# Patient Record
Sex: Female | Born: 1970
Health system: Southern US, Community
[De-identification: ages and names within clinical notes are randomized; demographics above are authoritative.]

## PROBLEM LIST (undated history)

## (undated) DIAGNOSIS — R0600 Dyspnea, unspecified: Secondary | ICD-10-CM

## (undated) DIAGNOSIS — F419 Anxiety disorder, unspecified: Secondary | ICD-10-CM

## (undated) DIAGNOSIS — I10 Essential (primary) hypertension: Secondary | ICD-10-CM

## (undated) DIAGNOSIS — K5900 Constipation, unspecified: Secondary | ICD-10-CM

## (undated) DIAGNOSIS — K219 Gastro-esophageal reflux disease without esophagitis: Secondary | ICD-10-CM

## (undated) DIAGNOSIS — E538 Deficiency of other specified B group vitamins: Secondary | ICD-10-CM

## (undated) DIAGNOSIS — E785 Hyperlipidemia, unspecified: Secondary | ICD-10-CM

## (undated) DIAGNOSIS — D649 Anemia, unspecified: Secondary | ICD-10-CM

## (undated) DIAGNOSIS — R7303 Prediabetes: Secondary | ICD-10-CM

## (undated) DIAGNOSIS — J302 Other seasonal allergic rhinitis: Secondary | ICD-10-CM

## (undated) HISTORY — PX: WISDOM TOOTH EXTRACTION: SHX21

## (undated) HISTORY — DX: Dyspnea, unspecified: R06.00

## (undated) HISTORY — DX: Constipation, unspecified: K59.00

## (undated) HISTORY — PX: TUBAL LIGATION: SHX77

## (undated) HISTORY — DX: Deficiency of other specified B group vitamins: E53.8

## (undated) HISTORY — DX: Gastro-esophageal reflux disease without esophagitis: K21.9

## (undated) HISTORY — DX: Prediabetes: R73.03

---

## 1997-12-15 ENCOUNTER — Other Ambulatory Visit: Admission: RE | Admit: 1997-12-15 | Discharge: 1997-12-15 | Payer: Self-pay | Admitting: *Deleted

## 1998-03-18 ENCOUNTER — Other Ambulatory Visit: Admission: RE | Admit: 1998-03-18 | Discharge: 1998-03-18 | Payer: Self-pay | Admitting: *Deleted

## 1998-04-27 ENCOUNTER — Other Ambulatory Visit: Admission: RE | Admit: 1998-04-27 | Discharge: 1998-04-27 | Payer: Self-pay | Admitting: *Deleted

## 1998-10-05 ENCOUNTER — Other Ambulatory Visit: Admission: RE | Admit: 1998-10-05 | Discharge: 1998-10-05 | Payer: Self-pay | Admitting: *Deleted

## 1999-04-14 ENCOUNTER — Other Ambulatory Visit: Admission: RE | Admit: 1999-04-14 | Discharge: 1999-04-14 | Payer: Self-pay | Admitting: *Deleted

## 1999-09-14 ENCOUNTER — Other Ambulatory Visit: Admission: RE | Admit: 1999-09-14 | Discharge: 1999-09-14 | Payer: Self-pay | Admitting: Obstetrics and Gynecology

## 1999-11-07 ENCOUNTER — Ambulatory Visit (HOSPITAL_COMMUNITY): Admission: RE | Admit: 1999-11-07 | Discharge: 1999-11-07 | Payer: Self-pay | Admitting: Obstetrics and Gynecology

## 1999-11-07 ENCOUNTER — Encounter: Payer: Self-pay | Admitting: Obstetrics and Gynecology

## 1999-12-13 ENCOUNTER — Other Ambulatory Visit: Admission: RE | Admit: 1999-12-13 | Discharge: 1999-12-13 | Payer: Self-pay | Admitting: Obstetrics and Gynecology

## 2000-05-08 ENCOUNTER — Inpatient Hospital Stay (HOSPITAL_COMMUNITY): Admission: AD | Admit: 2000-05-08 | Discharge: 2000-05-08 | Payer: Self-pay | Admitting: *Deleted

## 2000-06-17 ENCOUNTER — Inpatient Hospital Stay (HOSPITAL_COMMUNITY): Admission: AD | Admit: 2000-06-17 | Discharge: 2000-06-20 | Payer: Self-pay | Admitting: *Deleted

## 2000-07-02 ENCOUNTER — Encounter: Admission: RE | Admit: 2000-07-02 | Discharge: 2000-08-01 | Payer: Self-pay | Admitting: *Deleted

## 2001-01-23 ENCOUNTER — Other Ambulatory Visit: Admission: RE | Admit: 2001-01-23 | Discharge: 2001-01-23 | Payer: Self-pay | Admitting: *Deleted

## 2002-02-09 ENCOUNTER — Other Ambulatory Visit: Admission: RE | Admit: 2002-02-09 | Discharge: 2002-02-09 | Payer: Self-pay | Admitting: *Deleted

## 2003-02-12 ENCOUNTER — Other Ambulatory Visit: Admission: RE | Admit: 2003-02-12 | Discharge: 2003-02-12 | Payer: Self-pay | Admitting: *Deleted

## 2009-01-12 DIAGNOSIS — I10 Essential (primary) hypertension: Secondary | ICD-10-CM | POA: Insufficient documentation

## 2013-01-22 ENCOUNTER — Other Ambulatory Visit (HOSPITAL_BASED_OUTPATIENT_CLINIC_OR_DEPARTMENT_OTHER): Payer: Self-pay | Admitting: Obstetrics and Gynecology

## 2013-01-22 DIAGNOSIS — Z1231 Encounter for screening mammogram for malignant neoplasm of breast: Secondary | ICD-10-CM

## 2013-01-23 ENCOUNTER — Ambulatory Visit (HOSPITAL_BASED_OUTPATIENT_CLINIC_OR_DEPARTMENT_OTHER): Payer: Self-pay

## 2013-01-30 ENCOUNTER — Ambulatory Visit (HOSPITAL_BASED_OUTPATIENT_CLINIC_OR_DEPARTMENT_OTHER)
Admission: RE | Admit: 2013-01-30 | Discharge: 2013-01-30 | Disposition: A | Discharge status: Home / Self Care | Payer: BC Managed Care – PPO | Source: Ambulatory Visit | Attending: Obstetrics and Gynecology | Admitting: Obstetrics and Gynecology

## 2013-01-30 DIAGNOSIS — Z1231 Encounter for screening mammogram for malignant neoplasm of breast: Secondary | ICD-10-CM | POA: Insufficient documentation

## 2013-12-01 DIAGNOSIS — E7800 Pure hypercholesterolemia, unspecified: Secondary | ICD-10-CM | POA: Insufficient documentation

## 2014-03-02 ENCOUNTER — Other Ambulatory Visit (HOSPITAL_BASED_OUTPATIENT_CLINIC_OR_DEPARTMENT_OTHER): Payer: Self-pay | Admitting: Obstetrics and Gynecology

## 2014-03-02 DIAGNOSIS — Z1231 Encounter for screening mammogram for malignant neoplasm of breast: Secondary | ICD-10-CM

## 2014-03-26 ENCOUNTER — Ambulatory Visit (HOSPITAL_BASED_OUTPATIENT_CLINIC_OR_DEPARTMENT_OTHER)
Admission: RE | Admit: 2014-03-26 | Discharge: 2014-03-26 | Disposition: A | Discharge status: Home / Self Care | Payer: BC Managed Care – PPO | Source: Ambulatory Visit | Attending: Diagnostic Radiology | Admitting: Diagnostic Radiology

## 2014-03-26 DIAGNOSIS — Z1231 Encounter for screening mammogram for malignant neoplasm of breast: Secondary | ICD-10-CM | POA: Diagnosis not present

## 2015-03-22 ENCOUNTER — Other Ambulatory Visit (HOSPITAL_BASED_OUTPATIENT_CLINIC_OR_DEPARTMENT_OTHER): Payer: Self-pay | Admitting: Obstetrics and Gynecology

## 2015-03-22 DIAGNOSIS — Z1231 Encounter for screening mammogram for malignant neoplasm of breast: Secondary | ICD-10-CM

## 2015-04-01 ENCOUNTER — Ambulatory Visit (HOSPITAL_BASED_OUTPATIENT_CLINIC_OR_DEPARTMENT_OTHER)
Admission: RE | Admit: 2015-04-01 | Discharge: 2015-04-01 | Disposition: A | Discharge status: Home / Self Care | Payer: BLUE CROSS/BLUE SHIELD | Source: Ambulatory Visit | Attending: Obstetrics and Gynecology | Admitting: Obstetrics and Gynecology

## 2015-04-01 DIAGNOSIS — Z1231 Encounter for screening mammogram for malignant neoplasm of breast: Secondary | ICD-10-CM | POA: Diagnosis not present

## 2015-05-06 DIAGNOSIS — D519 Vitamin B12 deficiency anemia, unspecified: Secondary | ICD-10-CM | POA: Insufficient documentation

## 2016-10-03 DIAGNOSIS — D500 Iron deficiency anemia secondary to blood loss (chronic): Secondary | ICD-10-CM | POA: Insufficient documentation

## 2016-10-16 DIAGNOSIS — J302 Other seasonal allergic rhinitis: Secondary | ICD-10-CM | POA: Insufficient documentation

## 2017-03-21 ENCOUNTER — Other Ambulatory Visit (HOSPITAL_BASED_OUTPATIENT_CLINIC_OR_DEPARTMENT_OTHER): Payer: Self-pay | Admitting: Obstetrics and Gynecology

## 2017-03-21 DIAGNOSIS — Z1239 Encounter for other screening for malignant neoplasm of breast: Secondary | ICD-10-CM

## 2017-04-09 ENCOUNTER — Ambulatory Visit (HOSPITAL_BASED_OUTPATIENT_CLINIC_OR_DEPARTMENT_OTHER)
Admission: RE | Admit: 2017-04-09 | Discharge: 2017-04-09 | Disposition: A | Discharge status: Home / Self Care | Payer: 59 | Source: Ambulatory Visit | Attending: Obstetrics and Gynecology | Admitting: Obstetrics and Gynecology

## 2017-04-09 ENCOUNTER — Encounter (HOSPITAL_BASED_OUTPATIENT_CLINIC_OR_DEPARTMENT_OTHER): Payer: Self-pay

## 2017-04-09 DIAGNOSIS — Z1239 Encounter for other screening for malignant neoplasm of breast: Secondary | ICD-10-CM

## 2017-04-09 DIAGNOSIS — Z1231 Encounter for screening mammogram for malignant neoplasm of breast: Secondary | ICD-10-CM | POA: Insufficient documentation

## 2017-08-01 DIAGNOSIS — F4322 Adjustment disorder with anxiety: Secondary | ICD-10-CM | POA: Insufficient documentation

## 2017-11-25 ENCOUNTER — Other Ambulatory Visit: Payer: Self-pay | Admitting: Obstetrics and Gynecology

## 2017-12-04 NOTE — Patient Instructions (Addendum)
Your procedure is scheduled on: Thursday, July 25  Enter through the [employer] [facility] at: [address] up the phone at the desk and dial [phone].  Call this number if you have problems the morning of surgery: [phone].  Remember: Do NOT eat food after midnight Wednesday  Do NOT drink clear liquids (including water) after 7:30 am Thursday, day of surgery  Take these medicines the morning of surgery with a SIP OF WATER: Lexapro  Brush your teeth on the morning of surgery.  Stop herbal medications, vitamin supplements, Ibuprofen/NSAIDS 1 week prior to surgery.  Do NOT wear jewelry (body piercing), metal hair clips/bobby pins, make-up, or nail polish. Do NOT wear lotions, powders, or perfumes.  You may wear deoderant. Do NOT shave for 48 hours prior to surgery. Do NOT bring valuables to the hospital.  Have a responsible adult drive you home and stay with you for 24 hours after your procedure.  Home with Husband Nancy Sanders cell [phone]

## 2017-12-16 ENCOUNTER — Other Ambulatory Visit: Payer: Self-pay

## 2017-12-16 ENCOUNTER — Encounter (HOSPITAL_COMMUNITY): Payer: Self-pay

## 2017-12-16 ENCOUNTER — Encounter (HOSPITAL_COMMUNITY)
Admission: RE | Admit: 2017-12-16 | Discharge: 2017-12-16 | Disposition: A | Discharge status: Home / Self Care | Payer: 59 | Source: Ambulatory Visit | Attending: Obstetrics and Gynecology | Admitting: Obstetrics and Gynecology

## 2017-12-16 DIAGNOSIS — Z79899 Other long term (current) drug therapy: Secondary | ICD-10-CM | POA: Diagnosis not present

## 2017-12-16 DIAGNOSIS — F419 Anxiety disorder, unspecified: Secondary | ICD-10-CM | POA: Diagnosis not present

## 2017-12-16 DIAGNOSIS — E785 Hyperlipidemia, unspecified: Secondary | ICD-10-CM | POA: Diagnosis not present

## 2017-12-16 DIAGNOSIS — Z9851 Tubal ligation status: Secondary | ICD-10-CM | POA: Diagnosis not present

## 2017-12-16 DIAGNOSIS — D250 Submucous leiomyoma of uterus: Secondary | ICD-10-CM | POA: Diagnosis not present

## 2017-12-16 DIAGNOSIS — I10 Essential (primary) hypertension: Secondary | ICD-10-CM | POA: Diagnosis not present

## 2017-12-16 DIAGNOSIS — N920 Excessive and frequent menstruation with regular cycle: Secondary | ICD-10-CM | POA: Diagnosis not present

## 2017-12-16 DIAGNOSIS — Z793 Long term (current) use of hormonal contraceptives: Secondary | ICD-10-CM | POA: Diagnosis not present

## 2017-12-16 HISTORY — DX: Other seasonal allergic rhinitis: J30.2

## 2017-12-16 HISTORY — DX: Anemia, unspecified: D64.9

## 2017-12-16 HISTORY — DX: Anxiety disorder, unspecified: F41.9

## 2017-12-16 HISTORY — DX: Essential (primary) hypertension: I10

## 2017-12-16 HISTORY — DX: Hyperlipidemia, unspecified: E78.5

## 2017-12-16 LAB — CBC
HCT: 32.3 % — ABNORMAL LOW (ref 36.0–46.0)
Hemoglobin: 10.1 g/dL — ABNORMAL LOW (ref 12.0–15.0)
MCH: 25.4 pg — ABNORMAL LOW (ref 26.0–34.0)
MCHC: 31.3 g/dL (ref 30.0–36.0)
MCV: 81.2 fL (ref 78.0–100.0)
Platelets: 366 10*3/uL (ref 150–400)
RBC: 3.98 MIL/uL (ref 3.87–5.11)
RDW: 15.7 % — ABNORMAL HIGH (ref 11.5–15.5)
WBC: 7.7 10*3/uL (ref 4.0–10.5)

## 2017-12-16 LAB — BASIC METABOLIC PANEL
Anion gap: 11 (ref 5–15)
BUN: 7 mg/dL (ref 6–20)
CO2: 24 mmol/L (ref 22–32)
Calcium: 8.9 mg/dL (ref 8.9–10.3)
Chloride: 103 mmol/L (ref 98–111)
Creatinine, Ser: 0.6 mg/dL (ref 0.44–1.00)
GFR calc Af Amer: >60 mL/min (ref >60)
GFR calc non Af Amer: >60 mL/min (ref >60)
Glucose, Bld: 128 mg/dL — ABNORMAL HIGH (ref 70–99)
Potassium: 3.2 mmol/L — ABNORMAL LOW (ref 3.5–5.1)
Sodium: 138 mmol/L (ref 135–145)

## 2017-12-18 NOTE — Anesthesia Preprocedure Evaluation (Addendum)
Anesthesia Evaluation  Patient identified by MRN, date of birth, ID band Patient awake    Reviewed: Allergy & Precautions, NPO status , Patient's Chart, lab work & pertinent test results  Airway Mallampati: II  TM Distance: >3 FB Neck ROM: Full    Dental no notable dental hx. (+) Teeth Intact, Dental Advisory Given   Pulmonary neg pulmonary ROS,    Pulmonary exam normal breath sounds clear to auscultation       Cardiovascular Exercise Tolerance: Good hypertension, Pt. on medications Normal cardiovascular exam Rhythm:Regular Rate:Normal     Neuro/Psych negative neurological ROS  negative psych ROS   GI/Hepatic   Endo/Other    Renal/GU      Musculoskeletal   Abdominal (+) + obese,   Peds  Hematology  (+) anemia ,   Anesthesia Other Findings   Reproductive/Obstetrics negative OB ROS                             Lab Results  Component Value Date   WBC 7.7 12/16/2017   HGB 10.1 (L) 12/16/2017   HCT 32.3 (L) 12/16/2017   MCV 81.2 12/16/2017   PLT 366 12/16/2017    Anesthesia Physical Anesthesia Plan  ASA: II  Anesthesia Plan: General   Post-op Pain Management:    Induction: Intravenous  PONV Risk Score and Plan: Treatment may vary due to age or medical condition  Airway Management Planned: LMA  Additional Equipment:   Intra-op Plan:   Post-operative Plan:   Informed Consent: I have reviewed the patients History and Physical, chart, labs and discussed the procedure including the risks, benefits and alternatives for the proposed anesthesia with the patient or authorized representative who has indicated his/her understanding and acceptance.     Plan Discussed with: CRNA  Anesthesia Plan Comments:         Anesthesia Quick Evaluation

## 2017-12-19 ENCOUNTER — Ambulatory Visit (HOSPITAL_COMMUNITY): Payer: 59 | Admitting: Anesthesiology

## 2017-12-19 ENCOUNTER — Encounter (HOSPITAL_COMMUNITY): Payer: Self-pay | Admitting: *Deleted

## 2017-12-19 ENCOUNTER — Ambulatory Visit (HOSPITAL_COMMUNITY)
Admission: AD | Admit: 2017-12-19 | Discharge: 2017-12-19 | Disposition: A | Discharge status: Home / Self Care | Payer: 59 | Source: Ambulatory Visit | Attending: Obstetrics and Gynecology | Admitting: Obstetrics and Gynecology

## 2017-12-19 ENCOUNTER — Encounter (HOSPITAL_COMMUNITY)
Admission: AD | Disposition: A | Discharge status: Home / Self Care | Payer: Self-pay | Source: Ambulatory Visit | Attending: Obstetrics and Gynecology

## 2017-12-19 ENCOUNTER — Other Ambulatory Visit: Payer: Self-pay

## 2017-12-19 DIAGNOSIS — Z793 Long term (current) use of hormonal contraceptives: Secondary | ICD-10-CM | POA: Insufficient documentation

## 2017-12-19 DIAGNOSIS — I10 Essential (primary) hypertension: Secondary | ICD-10-CM | POA: Insufficient documentation

## 2017-12-19 DIAGNOSIS — N920 Excessive and frequent menstruation with regular cycle: Secondary | ICD-10-CM | POA: Diagnosis not present

## 2017-12-19 DIAGNOSIS — Z79899 Other long term (current) drug therapy: Secondary | ICD-10-CM | POA: Insufficient documentation

## 2017-12-19 DIAGNOSIS — D250 Submucous leiomyoma of uterus: Secondary | ICD-10-CM | POA: Insufficient documentation

## 2017-12-19 DIAGNOSIS — Z9851 Tubal ligation status: Secondary | ICD-10-CM | POA: Insufficient documentation

## 2017-12-19 DIAGNOSIS — E785 Hyperlipidemia, unspecified: Secondary | ICD-10-CM | POA: Insufficient documentation

## 2017-12-19 DIAGNOSIS — F419 Anxiety disorder, unspecified: Secondary | ICD-10-CM | POA: Insufficient documentation

## 2017-12-19 HISTORY — PX: DILATATION & CURETTAGE/HYSTEROSCOPY WITH MYOSURE: SHX6511

## 2017-12-19 HISTORY — PX: DILITATION & CURRETTAGE/HYSTROSCOPY WITH NOVASURE ABLATION: SHX5568

## 2017-12-19 SURGERY — DILATATION & CURETTAGE/HYSTEROSCOPY WITH NOVASURE ABLATION
Anesthesia: General | Site: Vagina

## 2017-12-19 MED ORDER — SODIUM CHLORIDE 0.9 % IR SOLN
Status: DC | PRN
  Administered 2017-12-19: 3000 mL

## 2017-12-19 MED ORDER — HYDROCODONE-ACETAMINOPHEN 7.5-325 MG PO TABS: 1.0000 | ORAL_TABLET | Freq: Once | ORAL | Status: DC | PRN

## 2017-12-19 MED ORDER — ACETAMINOPHEN 500 MG PO TABS
ORAL_TABLET | ORAL | Status: AC
  Administered 2017-12-19: 1000 mg via ORAL
  Filled 2017-12-19: qty 2

## 2017-12-19 MED ORDER — HYDROMORPHONE HCL 1 MG/ML IJ SOLN
INTRAMUSCULAR | Status: AC
  Filled 2017-12-19: qty 1

## 2017-12-19 MED ORDER — ONDANSETRON HCL 4 MG/2ML IJ SOLN
INTRAMUSCULAR | Status: AC
  Filled 2017-12-19: qty 2

## 2017-12-19 MED ORDER — GABAPENTIN 100 MG PO CAPS: 200.0000 mg | ORAL_CAPSULE | Freq: Once | ORAL | Status: DC

## 2017-12-19 MED ORDER — LIDOCAINE HCL (CARDIAC) PF 100 MG/5ML IV SOSY
PREFILLED_SYRINGE | INTRAVENOUS | Status: AC
  Filled 2017-12-19: qty 5

## 2017-12-19 MED ORDER — PROPOFOL 10 MG/ML IV BOLUS
INTRAVENOUS | Status: DC | PRN
  Administered 2017-12-19: 180 mg via INTRAVENOUS

## 2017-12-19 MED ORDER — SCOPOLAMINE 1 MG/3DAYS TD PT72
MEDICATED_PATCH | TRANSDERMAL | Status: AC
  Administered 2017-12-19: 1.5 mg via TRANSDERMAL
  Filled 2017-12-19: qty 1

## 2017-12-19 MED ORDER — HYDROMORPHONE HCL 1 MG/ML IJ SOLN
0.2500 mg | INTRAMUSCULAR | Status: DC | PRN
  Administered 2017-12-19 (×2): 0.5 mg via INTRAVENOUS

## 2017-12-19 MED ORDER — OXYCODONE-ACETAMINOPHEN 5-325 MG PO TABS: 1.0000 | ORAL_TABLET | ORAL | 0 refills | Status: AC | PRN

## 2017-12-19 MED ORDER — MIDAZOLAM HCL 2 MG/2ML IJ SOLN
INTRAMUSCULAR | Status: AC
  Filled 2017-12-19: qty 2

## 2017-12-19 MED ORDER — GABAPENTIN 300 MG PO CAPS
ORAL_CAPSULE | ORAL | Status: AC
  Administered 2017-12-19: 300 mg via ORAL
  Filled 2017-12-19: qty 1

## 2017-12-19 MED ORDER — MIDAZOLAM HCL 2 MG/2ML IJ SOLN
INTRAMUSCULAR | Status: DC | PRN
  Administered 2017-12-19: 1 mg via INTRAVENOUS

## 2017-12-19 MED ORDER — LIDOCAINE HCL (CARDIAC) PF 100 MG/5ML IV SOSY
PREFILLED_SYRINGE | INTRAVENOUS | Status: DC | PRN
  Administered 2017-12-19: 100 mg via INTRAVENOUS

## 2017-12-19 MED ORDER — FENTANYL CITRATE (PF) 100 MCG/2ML IJ SOLN
INTRAMUSCULAR | Status: DC | PRN
  Administered 2017-12-19 (×2): 50 ug via INTRAVENOUS

## 2017-12-19 MED ORDER — ONDANSETRON HCL 4 MG/2ML IJ SOLN
INTRAMUSCULAR | Status: DC | PRN
  Administered 2017-12-19: 4 mg via INTRAVENOUS

## 2017-12-19 MED ORDER — ACETAMINOPHEN 10 MG/ML IV SOLN: 1000.0000 mg | Freq: Once | INTRAVENOUS | Status: DC | PRN

## 2017-12-19 MED ORDER — ACETAMINOPHEN 500 MG PO TABS
1000.0000 mg | ORAL_TABLET | Freq: Once | ORAL | Status: AC
  Administered 2017-12-19: 1000 mg via ORAL

## 2017-12-19 MED ORDER — MEPERIDINE HCL 25 MG/ML IJ SOLN: 6.2500 mg | INTRAMUSCULAR | Status: DC | PRN

## 2017-12-19 MED ORDER — GABAPENTIN 300 MG PO CAPS
300.0000 mg | ORAL_CAPSULE | Freq: Once | ORAL | Status: AC
  Administered 2017-12-19: 300 mg via ORAL

## 2017-12-19 MED ORDER — LACTATED RINGERS IV SOLN
INTRAVENOUS | Status: DC
  Administered 2017-12-19: 13:00:00 via INTRAVENOUS

## 2017-12-19 MED ORDER — PROPOFOL 10 MG/ML IV BOLUS
INTRAVENOUS | Status: AC
  Filled 2017-12-19: qty 20

## 2017-12-19 MED ORDER — DEXAMETHASONE SODIUM PHOSPHATE 10 MG/ML IJ SOLN
INTRAMUSCULAR | Status: AC
  Filled 2017-12-19: qty 1

## 2017-12-19 MED ORDER — PROMETHAZINE HCL 25 MG/ML IJ SOLN: 6.2500 mg | INTRAMUSCULAR | Status: DC | PRN

## 2017-12-19 MED ORDER — SCOPOLAMINE 1 MG/3DAYS TD PT72
1.0000 | MEDICATED_PATCH | Freq: Once | TRANSDERMAL | Status: DC
  Administered 2017-12-19: 1.5 mg via TRANSDERMAL

## 2017-12-19 MED ORDER — CHLOROPROCAINE HCL 1 % IJ SOLN
INTRAMUSCULAR | Status: AC
  Filled 2017-12-19: qty 30

## 2017-12-19 MED ORDER — DEXAMETHASONE SODIUM PHOSPHATE 10 MG/ML IJ SOLN
INTRAMUSCULAR | Status: DC | PRN
  Administered 2017-12-19: 10 mg via INTRAVENOUS

## 2017-12-19 MED ORDER — FENTANYL CITRATE (PF) 100 MCG/2ML IJ SOLN
INTRAMUSCULAR | Status: AC
  Filled 2017-12-19: qty 2

## 2017-12-19 SURGICAL SUPPLY — 16 items
ABLATOR SURESOUND NOVASURE (ABLATOR) ×3 IMPLANT
CANISTER SUCT 3000ML PPV (MISCELLANEOUS) ×3 IMPLANT
CATH ROBINSON RED A/P 16FR (CATHETERS) ×3 IMPLANT
FILTER ARTHROSCOPY CONVERTOR (FILTER) ×3 IMPLANT
GLOVE BIOGEL PI IND STRL 7.0 (GLOVE) ×4 IMPLANT
GLOVE BIOGEL PI INDICATOR 7.0 (GLOVE) ×2
GLOVE ECLIPSE 6.5 STRL STRAW (GLOVE) ×3 IMPLANT
GOWN STRL REUS W/TWL LRG LVL3 (GOWN DISPOSABLE) ×6 IMPLANT
MYOSURE XL FIBROID REM (MISCELLANEOUS) ×3
PACK VAGINAL MINOR WOMEN LF (CUSTOM PROCEDURE TRAY) ×3 IMPLANT
PAD OB MATERNITY 4.3X12.25 (PERSONAL CARE ITEMS) ×3 IMPLANT
SEAL ROD LENS SCOPE MYOSURE (ABLATOR) ×3 IMPLANT
SYSTEM TISS REMOVAL MYSR XL RM (MISCELLANEOUS) ×2 IMPLANT
TOWEL OR 17X24 6PK STRL BLUE (TOWEL DISPOSABLE) ×6 IMPLANT
TUBING AQUILEX INFLOW (TUBING) ×3 IMPLANT
TUBING AQUILEX OUTFLOW (TUBING) ×3 IMPLANT

## 2017-12-19 NOTE — Anesthesia Postprocedure Evaluation (Signed)
Anesthesia Post Note  Patient: Nancy Sanders  Procedure(s) Performed: DILATATION & CURETTAGE/HYSTEROSCOPY WITH NOVASURE ABLATION (N/A Vagina ) HYSTEROSCOPY WITH MYOSURE XL (N/A )     Patient location during evaluation: PACU Anesthesia Type: General Level of consciousness: awake and alert Pain management: pain level controlled Vital Signs Assessment: post-procedure vital signs reviewed and stable Respiratory status: spontaneous breathing, nonlabored ventilation, respiratory function stable and patient connected to nasal cannula oxygen Cardiovascular status: blood pressure returned to baseline and stable Postop Assessment: no apparent nausea or vomiting Anesthetic complications: no    Last Vitals:  Vitals:   12/19/17 1226  BP: 125/86  Pulse: 63  Resp: 16  Temp: 36.4 C  SpO2: 100%    Last Pain:  Vitals:   12/19/17 1226  TempSrc: Oral  PainSc: 2    Pain Goal: Patients Stated Pain Goal: 3 (12/19/17 1226)               Barnet Glasgow

## 2017-12-19 NOTE — Brief Op Note (Signed)
12/19/2017  2:34 PM  PATIENT:  Josephina Gip  47 y.o. female  PRE-OPERATIVE DIAGNOSIS:  Menorrhagia, Submucosal Fibroid  POST-OPERATIVE DIAGNOSIS:  menorrhagia, submucosal fibroid  PROCEDURE:  Diagnostic hysteroscopy, hysteroscopic resection of submucosal fibroid, novasure endometrial ablation  SURGEON:  Surgeon(s) and Role:    * Servando Salina, MD - Primary  PHYSICIAN ASSISTANT:   ASSISTANTS: none   ANESTHESIA:   general FINDINGS: tubal ostias seen . Sclerosing tubal ostial opening, right posterior lateral SM fibroid, nl endocervical canal EBL:  5 mL   BLOOD ADMINISTERED:none  DRAINS: none   LOCAL MEDICATIONS USED:  NONE  SPECIMEN:  Source of Specimen:  fibroid resection  DISPOSITION OF SPECIMEN:  PATHOLOGY  COUNTS:  YES  TOURNIQUET:  * No tourniquets in log *  DICTATION: .Other Dictation: Dictation Number [id number]  PLAN OF CARE: Discharge to home after PACU  PATIENT DISPOSITION:  PACU - hemodynamically stable.   Delay start of Pharmacological VTE agent (>24hrs) due to surgical blood loss or risk of bleeding: yes

## 2017-12-19 NOTE — Anesthesia Procedure Notes (Signed)
Procedure Name: LMA Insertion Date/Time: 12/19/2017 1:45 PM Performed by: Asher Muir, CRNA Pre-anesthesia Checklist: Patient being monitored, Patient identified, Emergency Drugs available and Suction available Patient Re-evaluated:Patient Re-evaluated prior to induction Oxygen Delivery Method: Circle system utilized Preoxygenation: Pre-oxygenation with 100% oxygen Induction Type: IV induction and Inhalational induction Ventilation: Mask ventilation without difficulty LMA: LMA inserted LMA Size: 4.0 Number of attempts: 1 Dental Injury: Teeth and Oropharynx as per pre-operative assessment

## 2017-12-19 NOTE — Discharge Instructions (Addendum)
DISCHARGE INSTRUCTIONS: HYSTEROSCOPY / ENDOMETRIAL ABLATION The following instructions have been prepared to help you care for yourself upon your return home.  May Remove Scop patch on or before:7/28  May take stool softner while taking narcotic pain medication to prevent constipation.  Drink plenty of water.  Personal hygiene:  Use sanitary pads for vaginal drainage, not tampons.  Shower the day after your procedure.  NO tub baths, pools or Jacuzzis for 2-3 weeks.  Wipe front to back after using the bathroom.  Activity and limitations:  Do NOT drive or operate any equipment for 24 hours. The effects of anesthesia are still present and drowsiness may result.  Do NOT rest in bed all day.  Walking is encouraged.  Walk up and down stairs slowly.  You may resume your normal activity in one to two days or as indicated by your physician. Sexual activity: NO intercourse for at least 2 weeks after the procedure, or as indicated by your Doctor.  Diet: Eat a light meal as desired this evening. You may resume your usual diet tomorrow.  Return to Work: You may resume your work activities in one to two days or as indicated by Marine scientist.  What to expect after your surgery: Expect to have vaginal bleeding/discharge for 2-3 days and spotting for up to 10 days. It is not unusual to have soreness for up to 1-2 weeks. You may have a slight burning sensation when you urinate for the first day. Mild cramps may continue for a couple of days. You may have a regular period in 2-6 weeks.  Call your doctor for any of the following:  Excessive vaginal bleeding or clotting, saturating and changing one pad every hour.  Inability to urinate 6 hours after discharge from hospital.  Pain not relieved by pain medication.  Fever of 100.4 F or greater.  Unusual vaginal discharge or odor.  Return to office _________________Call for an appointment ___________________ Patients signature:  ______________________ Nurses signature ________________________  Post Anesthesia Care Unit [phone]   Post Anesthesia Home Care Instructions  Activity: Get plenty of rest for the remainder of the day. A responsible individual must stay with you for 24 hours following the procedure.  For the next 24 hours, DO NOT: -Drive a car -Paediatric nurse -Drink alcoholic beverages -Take any medication unless instructed by your physician -Make any legal decisions or sign important papers.  Meals: Start with liquid foods such as gelatin or soup. Progress to regular foods as tolerated. Avoid greasy, spicy, heavy foods. If nausea and/or vomiting occur, drink only clear liquids until the nausea and/or vomiting subsides. Call your physician if vomiting continues.  Special Instructions/Symptoms: Your throat may feel dry or sore from the anesthesia or the breathing tube placed in your throat during surgery. If this causes discomfort, gargle with warm salt water. The discomfort should disappear within 24 hours.  If you had a scopolamine patch placed behind your ear for the management of post- operative nausea and/or vomiting:  1. The medication in the patch is effective for 72 hours, after which it should be removed.  Wrap patch in a tissue and discard in the trash. Wash hands thoroughly with soap and water. 2. You may remove the patch earlier than 72 hours if you experience unpleasant side effects which may include dry mouth, dizziness or visual disturbances. 3. Avoid touching the patch. Wash your hands with soap and water after contact with the patch.

## 2017-12-19 NOTE — Transfer of Care (Signed)
Immediate Anesthesia Transfer of Care Note  Patient: Nancy Sanders  Procedure(s) Performed: DILATATION & CURETTAGE/HYSTEROSCOPY WITH NOVASURE ABLATION (N/A Vagina ) HYSTEROSCOPY WITH MYOSURE XL (N/A )  Patient Location: PACU  Anesthesia Type:General  Level of Consciousness: sedated  Airway & Oxygen Therapy: Patient Spontanous Breathing and Patient connected to nasal cannula oxygen  Post-op Assessment: Report given to RN  Post vital signs: Reviewed and stable  Last Vitals:  Vitals Value Taken Time  BP 142/87 12/19/2017  2:18 PM  Temp    Pulse 86 12/19/2017  2:19 PM  Resp 14 12/19/2017  2:19 PM  SpO2 100 % 12/19/2017  2:19 PM  Vitals shown include unvalidated device data.  Last Pain:  Vitals:   12/19/17 1226  TempSrc: Oral  PainSc: 2       Patients Stated Pain Goal: 3 (37/00/52 5910)  Complications: No apparent anesthesia complications

## 2017-12-19 NOTE — H&P (Signed)
Nancy Sanders is an 47 y.o. female [phone] MWF hx TL with menorrhagia here for dx hysteroscopy, novasure endom ablation for heavy cycles. Hx uterine fibroids. Benign prolif endom on ebx  Pertinent Gynecological History: Menses: flow is excessive with use of 5 pads or tampons on heaviest days Bleeding: dysfunctional uterine bleeding Contraception: tubal ligation DES exposure: denies Blood transfusions: none Sexually transmitted diseases: no past history Previous GYN Procedures: none  Last mammogram: normal Date: 2019 Last pap: normal Date:ascus neg [facility]pc 2019 OB History: G3, P2  Menstrual History: Menarche age: n/a Patient's last menstrual period was 12/12/2017 (exact date).    Past Medical History:  Diagnosis Date  . Anemia   . Anxiety   . Hyperlipidemia   . Hypertension   . Seasonal allergies   . SVD (spontaneous vaginal delivery)    x 2    Past Surgical History:  Procedure Laterality Date  . TUBAL LIGATION    . WISDOM TOOTH EXTRACTION      History reviewed. No pertinent family history.  Social History:  reports that she has never smoked. She has never used smokeless tobacco. She reports that she drinks about 2.4 - 3.6 oz of alcohol per week. She reports that she does not use drugs.  Allergies:  Allergies  Allergen Reactions  . Aspirin Other (See Comments)    Eye swelling     Medications Prior to Admission  Medication Sig Dispense Refill Last Dose  . acetaminophen (TYLENOL) 500 MG tablet Take 1,000 mg by mouth daily as needed for moderate pain or headache.   12/18/2017 at Unknown time  . atorvastatin (LIPITOR) 80 MG tablet Take 80 mg by mouth at bedtime.   12/18/2017 at Unknown time  . escitalopram (LEXAPRO) 5 MG tablet Take 5 mg by mouth daily.   12/19/2017 at 0630  . lisinopril (PRINIVIL,ZESTRIL) 20 MG tablet Take 20 mg by mouth every evening.   1 12/18/2017 at Unknown time  . norethindrone (CAMILA) 0.35 MG tablet Take 1 tablet by mouth at bedtime.   12/18/2017  at Unknown time  . cyanocobalamin (,VITAMIN B-12,) 1000 MCG/ML injection Inject 1,000 mcg into the muscle every 30 (thirty) days.   More than a month at Unknown time  . diphenhydrAMINE (BENADRYL) 25 MG tablet Take 25 mg by mouth daily as needed for allergies.   Unknown at Unknown time    Review of Systems  All other systems reviewed and are negative.   Blood pressure 125/86, pulse 63, temperature 97.6 F (36.4 C), temperature source Oral, resp. rate 16, last menstrual period 12/12/2017, SpO2 100 %. Physical Exam  Constitutional: She is oriented to person, place, and time. She appears well-developed and well-nourished.  HENT:  Head: Atraumatic.  Eyes: EOM are normal.  Neck: Neck supple.  Cardiovascular: Regular rhythm.  Respiratory: Breath sounds normal.  GI: Bowel sounds are normal.  Genitourinary: Vagina normal.  Genitourinary Comments: Cervix closed Uterus irreg And no palp mass  Musculoskeletal: She exhibits no edema.  Neurological: She is alert and oriented to person, place, and time. She has normal reflexes.  Skin: Skin is warm and dry.  Psychiatric: She has a normal mood and affect.    No results found for this or any previous visit (from the past 24 hour(s)).  No results found.  Assessment/Plan: AUB Fibroid uterus P) dx hysteroscopy, novasure endometrial ablation, resection of SM if necessary,. Risk of surgery includes infection, bleeding, injury to surrounding organ structures, internal scar tissue, failre arate 10%, 9-% reduction in flow,  uterine perforation and its risk. All ? answered  Audryanna Zurita A Diangelo Radel 12/19/2017, 1:31 PM

## 2017-12-19 NOTE — Op Note (Signed)
Nancy Sanders, ANGST MEDICAL RECORD [id number] ACCOUNT [id number] DATE OF BIRTH:Mar 17, 1971 FACILITY: [facility] LOCATION: [facility] PHYSICIAN:Meital Riehl A. Shefali Ng, MD  OPERATIVE REPORT  DATE OF PROCEDURE:  12/19/2017  PREOPERATIVE DIAGNOSES:  Menorrhagia, fibroid uterus.  PROCEDURE PERFORMED:  Diagnostic hysteroscopy, hysteroscopic resection of submucosal fibroid, NovaSure endometrial ablation.  POSTOPERATIVE DIAGNOSIS:  Submucosal fibroid, menorrhagia.  ANESTHESIA:  General.  SURGEON:  Servando Salina, MD  ASSISTANT:  None.  DESCRIPTION OF PROCEDURE:  Under adequate general anesthesia, the patient was placed in the dorsal lithotomy position.  She was sterilely prepped and draped in the usual fashion.  The bladder was catheterized with a moderate amount of urine.  Examination under anesthesia revealed a slightly enlarged uterus, irregular.  No adnexal masses could be appreciated.  Bivalve speculum was placed in the vagina.  Single tooth tenaculum was placed on the anterior lip of the cervix.  The uterus sounded to 9 cm.  The  endocervical length was 3 cm given a cavity length of 6.  The cervix was then carefully dilated to a #21 Pratt dilator.  A resectoscope was inserted into the uterine cavity.  Both tubal ostia could be seen, but they were sclerosed.  In the right lower uterine segment was a submucosal fibroid projecting into the cavity.  At that point, using the XL MyoSure resectoscope apparatus, the fibroid was resected to its base.  The resectoscope was then removed.  The NovaSure endometrial ablation apparatus was then inserted.  Cavity width of 4.5 was noted.  Using a power of 149 watts, time of 53 seconds, endometrial ablation was completed.  The hysteroscope was inserted into the cavity.  A complete endometrial ablation was noted.  The site where the resection had been performed showed a pea size fibroid at the same location  At that point, decision was made not to resect that  remaining small portion in the event of increased bleeding as a result of doing procedure.  The procedure was then terminated by removing all instruments.  No lesion in the endocervical canal was  noted.  Specimen was  fibroid resection sent to pathology.    ESTIMATED BLOOD LOSS:  Minimal.  COMPLICATIONS:  None.    FLUID DEFICIT:  45 mL.    SPONGE AND INSTRUMENT COUNTS:  x2 was correct.    The patient tolerated the procedure well and was transferred to recovery room in stable condition.  TN/NUANCE  D:12/19/2017 T:12/19/2017 JOB:[id number]

## 2017-12-20 ENCOUNTER — Encounter (HOSPITAL_COMMUNITY): Payer: Self-pay | Admitting: Obstetrics and Gynecology

## 2018-02-11 DIAGNOSIS — Z2821 Immunization not carried out because of patient refusal: Secondary | ICD-10-CM | POA: Insufficient documentation

## 2018-05-06 ENCOUNTER — Other Ambulatory Visit (HOSPITAL_BASED_OUTPATIENT_CLINIC_OR_DEPARTMENT_OTHER): Payer: Self-pay | Admitting: Obstetrics and Gynecology

## 2018-05-06 DIAGNOSIS — Z1231 Encounter for screening mammogram for malignant neoplasm of breast: Secondary | ICD-10-CM

## 2018-05-14 ENCOUNTER — Encounter (HOSPITAL_BASED_OUTPATIENT_CLINIC_OR_DEPARTMENT_OTHER): Payer: Self-pay

## 2018-05-14 ENCOUNTER — Ambulatory Visit (HOSPITAL_BASED_OUTPATIENT_CLINIC_OR_DEPARTMENT_OTHER)
Admission: RE | Admit: 2018-05-14 | Discharge: 2018-05-14 | Disposition: A | Discharge status: Home / Self Care | Payer: 59 | Source: Ambulatory Visit | Attending: Obstetrics and Gynecology | Admitting: Obstetrics and Gynecology

## 2018-05-14 DIAGNOSIS — Z1231 Encounter for screening mammogram for malignant neoplasm of breast: Secondary | ICD-10-CM | POA: Diagnosis not present

## 2018-05-15 ENCOUNTER — Other Ambulatory Visit: Payer: Self-pay | Admitting: Obstetrics and Gynecology

## 2018-05-15 DIAGNOSIS — R928 Other abnormal and inconclusive findings on diagnostic imaging of breast: Secondary | ICD-10-CM

## 2018-05-22 ENCOUNTER — Other Ambulatory Visit: Payer: Self-pay | Admitting: Obstetrics and Gynecology

## 2018-05-22 ENCOUNTER — Ambulatory Visit
Admission: RE | Admit: 2018-05-22 | Discharge: 2018-05-22 | Disposition: A | Discharge status: Home / Self Care | Payer: 59 | Source: Ambulatory Visit | Attending: Obstetrics and Gynecology | Admitting: Obstetrics and Gynecology

## 2018-05-22 DIAGNOSIS — R928 Other abnormal and inconclusive findings on diagnostic imaging of breast: Secondary | ICD-10-CM

## 2018-05-22 DIAGNOSIS — N6489 Other specified disorders of breast: Secondary | ICD-10-CM

## 2018-05-29 ENCOUNTER — Ambulatory Visit
Admission: RE | Admit: 2018-05-29 | Discharge: 2018-05-29 | Disposition: A | Discharge status: Home / Self Care | Payer: 59 | Source: Ambulatory Visit | Attending: Obstetrics and Gynecology | Admitting: Obstetrics and Gynecology

## 2018-05-29 DIAGNOSIS — N6489 Other specified disorders of breast: Secondary | ICD-10-CM

## 2018-11-04 ENCOUNTER — Other Ambulatory Visit: Payer: Self-pay | Admitting: Obstetrics and Gynecology

## 2018-11-04 DIAGNOSIS — N6489 Other specified disorders of breast: Secondary | ICD-10-CM

## 2018-11-19 ENCOUNTER — Other Ambulatory Visit: Payer: Self-pay

## 2018-11-19 ENCOUNTER — Ambulatory Visit
Admission: RE | Admit: 2018-11-19 | Discharge: 2018-11-19 | Disposition: A | Discharge status: Home / Self Care | Payer: 59 | Source: Ambulatory Visit | Attending: Obstetrics and Gynecology | Admitting: Obstetrics and Gynecology

## 2018-11-19 DIAGNOSIS — N6489 Other specified disorders of breast: Secondary | ICD-10-CM

## 2019-03-03 ENCOUNTER — Other Ambulatory Visit: Payer: Self-pay

## 2019-03-03 ENCOUNTER — Encounter (INDEPENDENT_AMBULATORY_CARE_PROVIDER_SITE_OTHER): Payer: Self-pay | Admitting: Family Medicine

## 2019-03-03 ENCOUNTER — Encounter: Payer: Self-pay | Admitting: Family Medicine

## 2019-03-03 ENCOUNTER — Ambulatory Visit (INDEPENDENT_AMBULATORY_CARE_PROVIDER_SITE_OTHER): Payer: 59 | Admitting: Family Medicine

## 2019-03-03 VITALS — BP 120/81 | HR 77 | Temp 97.7°F | Ht 63.0 in | Wt 194.0 lb

## 2019-03-03 DIAGNOSIS — Z6834 Body mass index (BMI) 34.0-34.9, adult: Secondary | ICD-10-CM

## 2019-03-03 DIAGNOSIS — R739 Hyperglycemia, unspecified: Secondary | ICD-10-CM

## 2019-03-03 DIAGNOSIS — Z9189 Other specified personal risk factors, not elsewhere classified: Secondary | ICD-10-CM

## 2019-03-03 DIAGNOSIS — E538 Deficiency of other specified B group vitamins: Secondary | ICD-10-CM

## 2019-03-03 DIAGNOSIS — R0602 Shortness of breath: Secondary | ICD-10-CM

## 2019-03-03 DIAGNOSIS — R5383 Other fatigue: Secondary | ICD-10-CM | POA: Diagnosis not present

## 2019-03-03 DIAGNOSIS — Z1331 Encounter for screening for depression: Secondary | ICD-10-CM

## 2019-03-03 DIAGNOSIS — E7849 Other hyperlipidemia: Secondary | ICD-10-CM | POA: Diagnosis not present

## 2019-03-03 DIAGNOSIS — E669 Obesity, unspecified: Secondary | ICD-10-CM

## 2019-03-03 DIAGNOSIS — Z0289 Encounter for other administrative examinations: Secondary | ICD-10-CM

## 2019-03-04 LAB — COMPREHENSIVE METABOLIC PANEL
ALT: 16 IU/L (ref 0–32)
AST: 16 IU/L (ref 0–40)
Albumin/Globulin Ratio: 1.3 (ref 1.2–2.2)
Albumin: 4.2 g/dL (ref 3.8–4.8)
Alkaline Phosphatase: 88 IU/L (ref 39–117)
BUN/Creatinine Ratio: 14 (ref 9–23)
BUN: 7 mg/dL (ref 6–24)
Bilirubin Total: 0.3 mg/dL (ref 0.0–1.2)
CO2: 22 mmol/L (ref 20–29)
Calcium: 9.7 mg/dL (ref 8.7–10.2)
Chloride: 101 mmol/L (ref 96–106)
Creatinine, Ser: 0.49 mg/dL — ABNORMAL LOW (ref 0.57–1.00)
GFR calc Af Amer: 135 mL/min/{1.73_m2} (ref >59)
GFR calc non Af Amer: 117 mL/min/{1.73_m2} (ref >59)
Globulin, Total: 3.3 g/dL (ref 1.5–4.5)
Glucose: 115 mg/dL — ABNORMAL HIGH (ref 65–99)
Potassium: 4.6 mmol/L (ref 3.5–5.2)
Sodium: 141 mmol/L (ref 134–144)
Total Protein: 7.5 g/dL (ref 6.0–8.5)

## 2019-03-04 LAB — VITAMIN D 25 HYDROXY (VIT D DEFICIENCY, FRACTURES): Vit D, 25-Hydroxy: 16.8 ng/mL — ABNORMAL LOW (ref 30.0–100.0)

## 2019-03-04 LAB — T4, FREE: Free T4: 1.13 ng/dL (ref 0.82–1.77)

## 2019-03-04 LAB — VITAMIN B12: Vitamin B-12: 349 pg/mL (ref 232–1245)

## 2019-03-04 LAB — INSULIN, RANDOM: INSULIN: 18.2 u[IU]/mL (ref 2.6–24.9)

## 2019-03-04 LAB — TSH: TSH: 1.22 u[IU]/mL (ref 0.450–4.500)

## 2019-03-04 LAB — T3: T3, Total: 117 ng/dL (ref 71–180)

## 2019-03-05 NOTE — Progress Notes (Signed)
Office: [phone]  /  Fax: [phone]   Dear Nancy Sanders,   Thank you for referring Nancy Sanders to our clinic. The following note includes my evaluation and treatment recommendations.  HPI:   Chief Complaint: OBESITY    Nancy Sanders has been referred by Nancy Salina, MD for consultation regarding her obesity and obesity related comorbidities.    Nancy Sanders (MR# [id number]) is a 48 y.o. female who presents on 03/03/2019 for obesity evaluation and treatment. Current BMI is Body mass index is 34.37 kg/m. Nancy Sanders has been struggling with her weight for many years and has been unsuccessful in either losing weight, maintaining weight loss, or reaching her healthy weight goal.     Nancy Sanders attended our information session and states she is currently in the action stage of change and ready to dedicate time achieving and maintaining a healthier weight. Nancy Sanders is interested in becoming our patient and working on intensive lifestyle modifications including (but not limited to) diet, exercise and weight loss.    Nancy Sanders states her family eats meals together she thinks her family will eat healthier with  her her desired weight loss is 54 lbs she has been heavy most of  her life she started gaining weight after getting married her heaviest weight ever was 201 lbs she has significant food cravings issues  she snacks frequently in the evenings she skips meals frequently she is frequently drinking liquids with calories she frequently makes poor food choices she frequently eats larger portions than normal  she struggles with emotional eating    Fatigue Nancy Sanders feels her energy is lower than it should be. This has worsened with weight gain and has not worsened recently. Nancy Sanders admits to daytime somnolence and  admits to waking up still tired. Patient is at risk for obstructive sleep apnea. Patent has a history of symptoms of daytime fatigue. Patient  generally gets 7 hours of sleep per night, and states they generally have generally restful sleep. Snoring is present. Apneic episodes are not present. Epworth Sleepiness Score is 1.  Dyspnea on exertion Nancy Sanders notes increasing shortness of breath with exercising and seems to be worsening over time with weight gain. She notes getting out of breath sooner with activity than she used to. This has not gotten worse recently. Nancy Sanders denies orthopnea.  Hyperlipidemia Nancy Sanders has a family history of hyperlipidemia and coronary artery disease. She had labs done recently at [facility], and recently started Zetia and on high dose statin. She has been trying to improve her cholesterol levels with intensive lifestyle modification including a low saturated fat diet, exercise and weight loss. She denies any chest pain, claudication or myalgias.  Hyperglycemia Nancy Sanders has a history of some elevated blood glucose readings without a diagnosis of diabetes. She had a recent A1c done at [facility] 01/2019, it was 5.9. She notes polyphagia and denies nausea, vomiting, or hypoglycemia. She is not on metformin.  At risk for diabetes Nancy Sanders is at higher than average risk for developing diabetes due to her obesity and hyperglycemia. She currently denies polyuria or polydipsia.  Vitamin B12 Deficiency Nancy Sanders has a diagnosis of B12 insufficiency and notes fatigue. She is on B12 injections and denies a history of bariatric surgery or pernicious anemia.   Depression Screen Nancy Sanders's Food and Mood (modified PHQ-9) score was  Depression screen PHQ 2/9 03/03/2019  Decreased Interest 1  Down, Depressed, Hopeless 0  PHQ - 2 Score 1  Altered sleeping 2  Tired, decreased energy 2  Change in appetite 1  Feeling bad or failure about yourself  0  Trouble concentrating 0  Moving slowly or fidgety/restless 0  Suicidal thoughts 0  PHQ-9 Score 6  Difficult doing work/chores Not difficult at all    ASSESSMENT AND  PLAN:  Other fatigue - Plan: EKG 12-Lead, T3, T4, free, TSH  Shortness of breath on exertion  Other hyperlipidemia  Hyperglycemia - Plan: Comprehensive metabolic panel, Insulin, random  B12 nutritional deficiency - Plan: VITAMIN D 25 Hydroxy (Vit-D Deficiency, Fractures), Vitamin B12  Depression screening  At risk for diabetes mellitus  Class 1 obesity with serious comorbidity and body mass index (BMI) of 34.0 to 34.9 in adult, unspecified obesity type  PLAN:  Fatigue Nancy Sanders was informed that her fatigue may be related to obesity, depression or many other causes. Labs will be ordered, and in the meanwhile Nancy Sanders has agreed to work on diet, exercise and weight loss to help with fatigue. Proper sleep hygiene was discussed including the need for 7-8 hours of quality sleep each night. A sleep study was not ordered based on symptoms and Epworth score.  Dyspnea on exertion Nancy Sanders's shortness of breath appears to be obesity related and exercise induced. She has agreed to work on weight loss and gradually increase exercise to treat her exercise induced shortness of breath. If Nancy Sanders follows our instructions and loses weight without improvement of her shortness of breath, we will plan to refer to pulmonology. We will monitor this condition regularly. Nancy Sanders agrees to this plan.  Hyperlipidemia Nancy Sanders was informed of the American Heart Association Guidelines emphasizing intensive lifestyle modifications as the first line treatment for hyperlipidemia. We discussed many lifestyle modifications today in depth, and Nancy Sanders will continue to work on decreasing saturated fats such as fatty red meat, butter and many fried foods. She will also increase vegetables and lean protein in her diet and continue to work on exercise and weight loss efforts. Nancy Sanders is to start her diet prescription and we will recheck labs in 3 months. Nancy Sanders agrees to follow up with our clinic in 2  weeks.  Hyperglycemia We will check insulin and glucose today and results with be discussed with Nancy Sanders in 2 weeks at her follow up visit. In the meanwhile Darly was started on a lower simple carbohydrate diet prescription and will work on weight loss efforts.  Diabetes risk counseling Shrinidhi was given extended (15 minutes) diabetes prevention counseling today. She is 48 y.o. female and has risk factors for diabetes including obesity and hyperglycemia. We discussed intensive lifestyle modifications today with an emphasis on weight loss as well as increasing exercise and decreasing simple carbohydrates in her diet.  Vitamin B12 Deficiency Nancy Sanders will start B12 rich diet. B12 supplementation was not prescribed today. We will check labs today.  Depression Screen Nancy Sanders had a mildly positive depression screening. Depression is commonly associated with obesity and often results in emotional eating behaviors. We will monitor this closely and work on CBT to help improve the non-hunger eating patterns. Referral to Psychology may be required if no improvement is seen as she continues in our clinic.  Obesity Nancy Sanders is currently in the action stage of change and her goal is to continue with weight loss efforts. I recommend Nancy Sanders begin the structured treatment plan as follows:  She has agreed to follow the Category 3 plan, minus 1 egg and only 8 oz of lean protein at dinner. Nancy Sanders has been instructed to eventually work up to a goal of 150 minutes of  combined cardio and strengthening exercise per week for weight loss and overall health benefits. We discussed the following Behavioral Modification Strategies today: increasing lean protein intake and work on meal planning and easy cooking plans   She was informed of the importance of frequent follow up visits to maximize her success with intensive lifestyle modifications for her multiple health conditions. She was informed we would discuss  her lab results at her next visit unless there is a critical issue that needs to be addressed sooner. Nancy Sanders agreed to keep her next visit at the agreed upon time to discuss these results.  ALLERGIES: Allergies  Allergen Reactions   Aspirin Other (See Comments)    Eye swelling     MEDICATIONS: Current Outpatient Medications on File Prior to Visit  Medication Sig Dispense Refill   acetaminophen (TYLENOL) 500 MG tablet Take 1,000 mg by mouth daily as needed for moderate pain or headache.     atorvastatin (LIPITOR) 80 MG tablet Take 80 mg by mouth at bedtime.     calcium carbonate (TUMS - DOSED IN MG ELEMENTAL CALCIUM) 500 MG chewable tablet Chew 1 tablet by mouth daily as needed for indigestion or heartburn.     cyanocobalamin (,VITAMIN B-12,) 1000 MCG/ML injection Inject 1,000 mcg into the muscle every 30 (thirty) days.     ezetimibe (ZETIA) 10 MG tablet Take 10 mg by mouth daily.     lisinopril (PRINIVIL,ZESTRIL) 20 MG tablet Take 30 mg by mouth every evening.   1   No current facility-administered medications on file prior to visit.     PAST MEDICAL HISTORY: Past Medical History:  Diagnosis Date   Anemia    Anxiety    Constipation    Dyspnea    GERD (gastroesophageal reflux disease)    Hyperlipidemia    Hypertension    Prediabetes    Seasonal allergies    SVD (spontaneous vaginal delivery)    x 2   Vitamin B 12 deficiency     PAST SURGICAL HISTORY: Past Surgical History:  Procedure Laterality Date   DILATATION & CURETTAGE/HYSTEROSCOPY WITH MYOSURE N/A 12/19/2017   Procedure: HYSTEROSCOPY WITH MYOSURE XL;  Surgeon: Nancy Salina, MD;  Location: [facility];  Service: Gynecology;  Laterality: N/A;   DILITATION & CURRETTAGE/HYSTROSCOPY WITH NOVASURE ABLATION N/A 12/19/2017   Procedure: DILATATION & CURETTAGE/HYSTEROSCOPY WITH NOVASURE ABLATION;  Surgeon: Nancy Salina, MD;  Location: [facility];  Service: Gynecology;  Laterality: N/A;   TUBAL  LIGATION     WISDOM TOOTH EXTRACTION      SOCIAL HISTORY: Social History   Tobacco Use   Smoking status: Never Smoker   Smokeless tobacco: Never Used  Substance Use Topics   Alcohol use: Yes    Alcohol/week: 4.0 - 6.0 standard drinks    Types: 4 - 6 Standard drinks or equivalent per week   Drug use: Never    FAMILY HISTORY: Family History  Problem Relation Age of Onset   Diabetes Mother    Hypertension Mother    Obesity Mother    Diabetes Father    Hypertension Father    Hyperlipidemia Father    Heart disease Father    Stroke Father    Cancer Father    Sleep apnea Father    Obesity Father     ROS: Review of Systems  Constitutional: Positive for malaise/fatigue. Negative for weight loss.  Eyes:       + Wear glasses or contacts  Respiratory: Positive for shortness of breath (with exertion).  Cardiovascular: Negative for chest pain, orthopnea and claudication.  Gastrointestinal: Positive for heartburn. Negative for nausea and vomiting.  Genitourinary: Negative for frequency.  Musculoskeletal: Negative for myalgias.  Neurological: Positive for headaches.  Endo/Heme/Allergies: Negative for polydipsia. Bruises/bleeds easily.       Negative hypoglycemia Positive polyphagia    PHYSICAL EXAM: Blood pressure 120/81, pulse 77, temperature 97.7 F (36.5 C), temperature source Oral, height 5\' 3"  (1.6 m), weight 194 lb (88 kg), SpO2 100 %. Body mass index is 34.37 kg/m. Physical Exam Vitals signs reviewed.  Constitutional:      Appearance: Normal appearance. She is obese.  HENT:     Head: Normocephalic and atraumatic.     Nose: Nose normal.  Eyes:     General: No scleral icterus.    Extraocular Movements: Extraocular movements intact.  Neck:     Musculoskeletal: Normal range of motion and neck supple.     Comments: No thyromegaly present Cardiovascular:     Rate and Rhythm: Normal rate and regular rhythm.     Pulses: Normal pulses.     Heart  sounds: Normal heart sounds.  Pulmonary:     Effort: Pulmonary effort is normal. No respiratory distress.     Breath sounds: Normal breath sounds.  Abdominal:     Palpations: Abdomen is soft.     Tenderness: There is no abdominal tenderness.     Comments: + Obesity  Musculoskeletal: Normal range of motion.     Right lower leg: No edema.     Left lower leg: No edema.  Skin:    General: Skin is warm and dry.  Neurological:     Mental Status: She is alert and oriented to person, place, and time.     Coordination: Coordination normal.  Psychiatric:        Mood and Affect: Mood normal.        Behavior: Behavior normal.     RECENT LABS AND TESTS: BMET    Component Value Date/Time   NA 141 03/03/2019 0850   K 4.6 03/03/2019 0850   CL 101 03/03/2019 0850   CO2 22 03/03/2019 0850   GLUCOSE 115 (H) 03/03/2019 0850   GLUCOSE 128 (H) 12/16/2017 0923   BUN 7 03/03/2019 0850   CREATININE 0.49 (L) 03/03/2019 0850   CALCIUM 9.7 03/03/2019 0850   GFRNONAA 117 03/03/2019 0850   GFRAA 135 03/03/2019 0850   No results found for: HGBA1C Lab Results  Component Value Date   INSULIN 18.2 03/03/2019   CBC    Component Value Date/Time   WBC 7.7 12/16/2017 0923   RBC 3.98 12/16/2017 0923   HGB 10.1 (L) 12/16/2017 0923   HCT 32.3 (L) 12/16/2017 0923   PLT 366 12/16/2017 0923   MCV 81.2 12/16/2017 0923   MCH 25.4 (L) 12/16/2017 0923   MCHC 31.3 12/16/2017 0923   RDW 15.7 (H) 12/16/2017 0923   Iron/TIBC/Ferritin/ %Sat No results found for: IRON, TIBC, FERRITIN, IRONPCTSAT Lipid Panel  No results found for: CHOL, TRIG, HDL, CHOLHDL, VLDL, LDLCALC, LDLDIRECT Hepatic Function Panel     Component Value Date/Time   PROT 7.5 03/03/2019 0850   ALBUMIN 4.2 03/03/2019 0850   AST 16 03/03/2019 0850   ALT 16 03/03/2019 0850   ALKPHOS 88 03/03/2019 0850   BILITOT 0.3 03/03/2019 0850      Component Value Date/Time   TSH 1.220 03/03/2019 0850    ECG  shows NSR with a rate of 74  BPM INDIRECT CALORIMETER done today shows  a VO2 of 278 and a REE of 1934.  Her calculated basal metabolic rate is [id number] thus her basal metabolic rate is better than expected.       OBESITY BEHAVIORAL INTERVENTION VISIT  Today's visit was # 1   Starting weight: 194 lbs Starting date: 03/03/2019 Today's weight : 194 lbs  Today's date: 03/03/2019 Total lbs lost to date: 0    ASK: We discussed the diagnosis of obesity with Nancy Sanders today and Nancy Sanders agreed to give [place] permission to discuss obesity behavioral modification therapy today.  ASSESS: Jahiya has the diagnosis of obesity and her BMI today is 34.37 Pearley is in the action stage of change   ADVISE: Surie was educated on the multiple health risks of obesity as well as the benefit of weight loss to improve her health. She was advised of the need for long term treatment and the importance of lifestyle modifications to improve her current health and to decrease her risk of future health problems.  AGREE: Multiple dietary modification options and treatment options were discussed and  Kevan agreed to follow the recommendations documented in the above note.  ARRANGE: Nalah was educated on the importance of frequent visits to treat obesity as outlined per CMS and USPSTF guidelines and agreed to schedule her next follow up appointment today.  I, Trixie Dredge, am acting as transcriptionist for Dennard Nip, MD  I have reviewed the above documentation for accuracy and completeness, and I agree with the above. -Dennard Nip, MD

## 2019-03-09 NOTE — Progress Notes (Signed)
Office: [phone]  /  Fax: [phone]    Date: March 11, 2019   Appointment Start Time: 12:12pm Duration: 27 minutes Provider: Glennie Isle, Psy.D. Type of Session: Intake for Individual Therapy  Location of Patient: Parked in car  Location of Provider: Provider's Home Type of Contact: Telepsychological Visit via [employer] WebEx  Informed Consent: This provider called Nancy Sanders at 12:10pm to assist with connection for today's appointment. She noted she is ready and would join shortly. Of note, today's appointment was initiated late due to this provider. Prior to proceeding with today's appointment, two pieces of identifying information were obtained from Nancy Sanders to verify identity. In addition, Nancy Sanders's physical location at the time of this appointment was obtained. Nancy Sanders reported she was parked in her car at work and provided the address. In the event of technical difficulties, Nancy Sanders shared a phone number she could be reached at. Nancy Sanders and this provider participated in today's telepsychological service. Also, Nancy Sanders denied anyone else being present in the room or on the WebEx appointment.   The provider's role was explained to Nancy Sanders. The provider reviewed and discussed issues of confidentiality, privacy, and limits therein (e.g., reporting obligations). In addition to verbal informed consent, written informed consent for psychological services was obtained from [facility] prior to the initial intake interview. Written consent included information concerning the practice, financial arrangements, and confidentiality and patients' rights. Since the clinic is not a 24/7 crisis center, mental health emergency resources were shared, and the provider explained MyChart, e-mail, voicemail, and/or other messaging systems should be utilized only for non-emergency reasons. This provider also explained that information obtained during appointments will be placed in Nancy Sanders's  medical record in a confidential manner and relevant information will be shared with other providers at [facility] that she meets with for coordination of care. Nancy Sanders verbally acknowledged understanding of the aforementioned, and agreed to use mental health emergency resources discussed if needed. Moreover, Nancy Sanders agreed information may be shared with other [facility] providers as needed for coordination of care. By signing the service agreement document, Nancy Sanders provided written consent for coordination of care.   Prior to initiating telepsychological services, Nancy Sanders was provided with an informed consent document, which included the development of a safety plan (i.e., an emergency contact and emergency resources) in the event of an emergency/crisis. Nancy Sanders expressed understanding of the rationale of the safety plan and provided consent for this provider to reach out to her emergency contact in the event of an emergency/crisis. Nancy Sanders returned the completed consent form prior to today's appointment. This provider verbally reviewed the consent form during today's appointment prior to proceeding with the appointment. Nancy Sanders verbally acknowledged understanding that she is ultimately responsible for understanding her insurance benefits as it relates to reimbursement of telepsychological and in-person services. This provider also reviewed confidentiality, as it relates to telepsychological services, as well as the rationale for telepsychological services. More specifically, this provider's clinic is providing telepsychological services as an option for appointments to reduce exposure to COVID-19. Nancy Sanders expressed understanding regarding the rationale for telepsychological services. In addition, this provider explained the telepsychological services informed consent document would be considered an addendum to the initial consent document/service agreement. Nancy Sanders verbally  consented to proceed.   Chief Complaint/HPI: Nancy Sanders was referred by Nancy Sanders. During the initial appointment with Nancy Sanders at [facility] Sanders on March 03, 2019, Nancy Sanders reported experiencing the following: significant food cravings issues , snacking frequently in the evenings,  frequently drinking liquids with calories, frequently making poor food choices, frequently eating larger portions than normal , struggling with emotional eating and skipping meals frequently. Nancy Sanders's Food and Mood (modified PHQ-9) score was 6.  During today's appointment, Nancy Sanders was verbally administered a questionnaire assessing various behaviors related to emotional eating. Nancy Sanders endorsed the following: find food is comforting to you and overeat when you are worried about something. She shared she craves salty foods, such as pickles. She added, "I don't snack a whole a lot." Nancy Sanders noted she is unsure of the onset of emotional eating. She described the current frequency of emotional eating as "not very often." In addition, Nancy Sanders denied a history of binge eating. Nancy Sanders denied a history of restricting food intake, purging and engagement in other compensatory strategies, and has never been diagnosed with an eating disorder. She also denied a history of treatment for emotional eating. Moreover, Nancy Sanders noted a history of not eating breakfast. She further reported a history of trying diets and discussed feeling guilt when deviating. Regarding the current meal plan, Nancy Sanders reported she is "struggling to eat" all the food. Furthermore, Nancy Sanders denied other problems of concern.    Mental Status Examination:  Appearance: neat Behavior: cooperative Mood: euthymic Affect: mood congruent Speech: normal in rate, volume, and tone Eye Contact: appropriate Psychomotor Activity: appropriate Thought Process: linear, logical, and goal directed  Content/Perceptual Disturbances: denies suicidal  and homicidal ideation, plan, and intent and no hallucinations, delusions, bizarre thinking or behavior reported or observed Orientation: time, person, place and purpose of appointment Cognition/Sensorium: memory, attention, language, and fund of knowledge intact  Insight: good Judgment: good  Family & Psychosocial History: Yoltzin reported she is married and she has two children (ages 5 and 34). 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 Additionally, Teyona shared her highest level of education obtained is "two, two and a half years of college." Currently, Ortencia's social support system consists of her husband, co-workers, and close friends. Moreover, Mariapaula stated she resides with her husband and children.   Medical History:  Past Medical History:  Diagnosis Date   Anemia    Anxiety    Constipation    Dyspnea    GERD (gastroesophageal reflux disease)    Hyperlipidemia    Hypertension    Prediabetes    Seasonal allergies    SVD (spontaneous vaginal delivery)    x 2   Vitamin B 12 deficiency    Past Surgical History:  Procedure Laterality Date   DILATATION & CURETTAGE/HYSTEROSCOPY WITH MYOSURE N/A 12/19/2017   Procedure: HYSTEROSCOPY WITH MYOSURE XL;  Surgeon: Servando Salina, MD;  Location: [facility];  Service: Gynecology;  Laterality: N/A;   DILITATION & CURRETTAGE/HYSTROSCOPY WITH NOVASURE ABLATION N/A 12/19/2017   Procedure: DILATATION & CURETTAGE/HYSTEROSCOPY WITH NOVASURE ABLATION;  Surgeon: Servando Salina, MD;  Location: [facility];  Service: Gynecology;  Laterality: N/A;   TUBAL LIGATION     WISDOM TOOTH EXTRACTION     Current Outpatient Medications on File Prior to Visit  Medication Sig Dispense Refill   acetaminophen (TYLENOL) 500 MG tablet Take 1,000 mg by mouth daily as needed for moderate pain or headache.     atorvastatin (LIPITOR) 80 MG tablet Take 80 mg by mouth at bedtime.      calcium carbonate (TUMS - DOSED IN MG ELEMENTAL CALCIUM) 500 MG chewable tablet Chew 1 tablet by mouth daily as needed for indigestion or heartburn.     cyanocobalamin (,VITAMIN B-12,) 1000 MCG/ML  injection Inject 1,000 mcg into the muscle every 30 (thirty) days.     ezetimibe (ZETIA) 10 MG tablet Take 10 mg by mouth daily.     lisinopril (PRINIVIL,ZESTRIL) 20 MG tablet Take 30 mg by mouth every evening.   1   No current facility-administered medications on file prior to visit.   Merilynn denied a history of head injuries and loss of consciousness.    Mental Health History: Ismael denied a history of therapeutic services. Timi denied a history of hospitalizations for psychiatric concerns, and has never met with a psychiatrist. Renny stated was previously prescribed Xanax PRN and Lexapro. She explained she was prescribed Xanax for when she goes on cruises.  Lillyauna denied a family history of mental health related concerns. Milynn denied a trauma history, including psychological, physical  and sexual abuse, as well as neglect.   Flordia described her typical mood as "pretty good" and "pretty low key." Lasheba endorsed current alcohol use. More specifically, she noted she consumes 2-3 [employer] on work days and 4-5 mixed drinks on the weekends over the course of multiple hours. She denied tobacco use. She denied illicit/recreational substance use. Regarding caffeine intake, Mirtha reported consuming diet soda ("couple glasses") daily. Furthermore, Nancy Sanders denied experiencing the following: hopelessness, hallucinations and delusions, paranoia, symptoms of mania (e.g., expansive mood, flighty ideas, decreased need for sleep, engagement in risky behaviors), angry outbursts, crying spells, panic attacks and decreased motivation. She also denied history of and current suicidal ideation, plan, and intent; history of and current homicidal ideation, plan, and intent; and history of and current  engagement in self-harm.  The following strengths were reported by Nancy Sanders: nice person and get along with everyone. The following strengths were observed by this provider: ability to express thoughts and feelings during the therapeutic session, ability to establish and benefit from a therapeutic relationship, ability to learn and practice coping skills, willingness to work toward established goal(s) with the clinic and ability to engage in reciprocal conversation.  Legal History: Malerie denied a history of legal involvement.   Structured Assessment Results: The Patient Health Questionnaire-9 (PHQ-9) is a self-report measure that assesses symptoms and severity of depression over the course of the last two weeks. Nea obtained a score of 0. Little interest or pleasure in doing things 0  Feeling down, depressed, or hopeless 0  Trouble falling or staying asleep, or sleeping too much 0  Feeling tired or having little energy 0  Poor appetite or overeating 0  Feeling bad about yourself --- or that you are a failure or have let yourself or your family down 0  Trouble concentrating on things, such as reading the newspaper or watching television 0  Moving or speaking so slowly that other people could have noticed? Or the opposite --- being so fidgety or restless that you have been moving around a lot more than usual 0  Thoughts that you would be better off dead or hurting yourself in some way 0  PHQ-9 Score 0    The Generalized Anxiety Disorder-7 (GAD-7) is a brief self-report measure that assesses symptoms of anxiety over the course of the last two weeks. Brinnley obtained a score of 0. Feeling nervous, anxious, on edge 0  Not being able to stop or control worrying 0  Worrying too much about different things 0  Trouble relaxing 0  Being so restless that it's hard to sit still 0  Becoming easily annoyed or irritable 0  Feeling afraid as if something awful might  happen 0  GAD-7 Score 0    Interventions: A chart review was conducted prior to the clinical intake interview. The PHQ-9, and GAD-7 were verbally administered as well as a Mood and Food questionnaire to assess various behaviors related to emotional eating. Throughout session, empathic reflections and validation was provided. Afsheen declined future appointments. Psychoeducation regarding emotional versus physical hunger was provided to increase awareness of hunger patterns and subsequent eating. Nancy Sanders provided verbal consent during today's appointment for this provider to send the handout via e-mail.   Provisional DSM-5 Diagnosis: 311 (F32.8) Other Specified Depressive Disorder, Emotional Eating Behaviors  Plan: Ivis declined future appointments with this provider and noted her eating habits do not "dramatically" impact her life. She acknowledged understanding that she may request a follow-up appointment with this provider in the future as long as she is still established with the clinic. No further follow-up planned by this provider.

## 2019-03-11 ENCOUNTER — Other Ambulatory Visit: Payer: Self-pay

## 2019-03-11 ENCOUNTER — Ambulatory Visit (INDEPENDENT_AMBULATORY_CARE_PROVIDER_SITE_OTHER): Payer: 59 | Admitting: Psychology

## 2019-03-11 DIAGNOSIS — F3289 Other specified depressive episodes: Secondary | ICD-10-CM

## 2019-03-17 ENCOUNTER — Ambulatory Visit (INDEPENDENT_AMBULATORY_CARE_PROVIDER_SITE_OTHER): Payer: 59 | Admitting: Family Medicine

## 2019-03-17 ENCOUNTER — Other Ambulatory Visit: Payer: Self-pay

## 2019-03-17 VITALS — BP 110/76 | HR 91 | Temp 97.7°F | Ht 63.0 in | Wt 185.0 lb

## 2019-03-17 DIAGNOSIS — E559 Vitamin D deficiency, unspecified: Secondary | ICD-10-CM | POA: Diagnosis not present

## 2019-03-17 DIAGNOSIS — Z6832 Body mass index (BMI) 32.0-32.9, adult: Secondary | ICD-10-CM

## 2019-03-17 DIAGNOSIS — E669 Obesity, unspecified: Secondary | ICD-10-CM | POA: Diagnosis not present

## 2019-03-17 DIAGNOSIS — R7303 Prediabetes: Secondary | ICD-10-CM

## 2019-03-17 DIAGNOSIS — Z9189 Other specified personal risk factors, not elsewhere classified: Secondary | ICD-10-CM

## 2019-03-17 MED ORDER — VITAMIN D (ERGOCALCIFEROL) 1.25 MG (50000 UNIT) PO CAPS: 50000.0000 [IU] | ORAL_CAPSULE | ORAL | 0 refills | Status: DC

## 2019-03-18 NOTE — Progress Notes (Signed)
Office: [phone]  /  Fax: [phone]   HPI:   Chief Complaint: OBESITY Nancy Sanders is here to discuss her progress with her obesity treatment plan. She is on the Category 3 plan and is following her eating plan approximately 90 % of the time. She states she is walking for 2-3 miles 2-3 times per week. Nancy Sanders has done very well with follow her plan. She struggled with eating all of her protein at dinner. Her hunger was very controlled.  Her weight is 185 lb (83.9 kg) today and has had a weight loss of 9 pounds over a period of 2 weeks since her last visit. She has lost 9 lbs since starting treatment with [place].  Pre-Diabetes Nancy Sanders has a diagnosis of pre-diabetes based on her elevated Hgb A1c and was informed this puts her at greater risk of developing diabetes. Her recent A1c at her primary care physician office was 5.9. She has and elevated fasting glucose and insulin. Her polyphagia has greatly improved on her diet prescription. She is not taking metformin currently and continues to work on diet and exercise to decrease risk of diabetes. She denies nausea or hypoglycemia.  At risk for diabetes Nancy Sanders is at higher than average risk for developing diabetes due to her obesity and pre-diabetes. She currently denies polyuria or polydipsia.  Vitamin D Deficiency Nancy Sanders has a new diagnosis of vitamin D deficiency. She is not currently taking Vit D. She notes fatigue and denies nausea, vomiting or muscle weakness.  ASSESSMENT AND PLAN:  Prediabetes  Vitamin D deficiency - Plan: Vitamin D, Ergocalciferol, (DRISDOL) 1.25 MG (50000 UT) CAPS capsule  At risk for diabetes mellitus  Class 1 obesity with serious comorbidity and body mass index (BMI) of 32.0 to 32.9 in adult, unspecified obesity type  PLAN:  Pre-Diabetes Nancy Sanders will continue to work on weight loss, diet, exercise, and decreasing simple carbohydrates in her diet to help decrease the risk of diabetes. We dicussed  metformin including benefits and risks. She was informed that eating too many simple carbohydrates or too many calories at one sitting increases the likelihood of GI side effects. Nancy Sanders declined metformin for now and a prescription was not written today. Nancy Sanders agrees to follow up with [place] as directed to monitor her progress.  Diabetes risk counseling Nancy Sanders was given extended (30 minutes) diabetes prevention counseling today. She is 48 y.o. female and has risk factors for diabetes including obesity and pre-diabetes. We discussed intensive lifestyle modifications today with an emphasis on weight loss as well as increasing exercise and decreasing simple carbohydrates in her diet.  Vitamin D Deficiency Nancy Sanders was informed that low vitamin D levels contributes to fatigue and are associated with obesity, breast, and colon cancer. Nancy Sanders agrees to start prescription Vit D 50,000 IU every week #4 with no refills. She will follow up for routine testing of vitamin D, at least 2-3 times per year. She was informed of the risk of over-replacement of vitamin D and agrees to not increase her dose unless she discusses this with [place] first. Nancy Sanders agrees to follow up with our clinic in 2 weeks.  Obesity Nancy Sanders is currently in the action stage of change. As such, her goal is to continue with weight loss efforts She has agreed to follow the Category 3 plan  Lean meat equivalents were discussed.  Nancy Sanders has been instructed to work up to a goal of 150 minutes of combined cardio and strengthening exercise per week for weight loss and overall health benefits. We  discussed the following Behavioral Modification Strategies today: decreasing simple carbohydrates  and increasing vegetables   Nancy Sanders has agreed to follow up with our clinic in 2 weeks. She was informed of the importance of frequent follow up visits to maximize her success with intensive lifestyle modifications for her multiple health conditions.   ALLERGIES: Allergies  Allergen Reactions  . Aspirin Other (See Comments)    Eye swelling     MEDICATIONS: Current Outpatient Medications on File Prior to Visit  Medication Sig Dispense Refill  . acetaminophen (TYLENOL) 500 MG tablet Take 1,000 mg by mouth daily as needed for moderate pain or headache.    Marland Kitchen atorvastatin (LIPITOR) 80 MG tablet Take 80 mg by mouth at bedtime.    . calcium carbonate (TUMS - DOSED IN MG ELEMENTAL CALCIUM) 500 MG chewable tablet Chew 1 tablet by mouth daily as needed for indigestion or heartburn.    . cyanocobalamin (,VITAMIN B-12,) 1000 MCG/ML injection Inject 1,000 mcg into the muscle every 30 (thirty) days.    Marland Kitchen ezetimibe (ZETIA) 10 MG tablet Take 10 mg by mouth daily.    Marland Kitchen lisinopril (PRINIVIL,ZESTRIL) 20 MG tablet Take 30 mg by mouth every evening.   1   No current facility-administered medications on file prior to visit.     PAST MEDICAL HISTORY: Past Medical History:  Diagnosis Date  . Anemia   . Anxiety   . Constipation   . Dyspnea   . GERD (gastroesophageal reflux disease)   . Hyperlipidemia   . Hypertension   . Prediabetes   . Seasonal allergies   . SVD (spontaneous vaginal delivery)    x 2  . Vitamin B 12 deficiency     PAST SURGICAL HISTORY: Past Surgical History:  Procedure Laterality Date  . DILATATION & CURETTAGE/HYSTEROSCOPY WITH MYOSURE N/A 12/19/2017   Procedure: HYSTEROSCOPY WITH MYOSURE XL;  Surgeon: Servando Salina, MD;  Location: [facility];  Service: Gynecology;  Laterality: N/A;  . DILITATION & CURRETTAGE/HYSTROSCOPY WITH NOVASURE ABLATION N/A 12/19/2017   Procedure: DILATATION & CURETTAGE/HYSTEROSCOPY WITH NOVASURE ABLATION;  Surgeon: Servando Salina, MD;  Location: [facility];  Service: Gynecology;  Laterality: N/A;  . TUBAL LIGATION    . WISDOM TOOTH EXTRACTION      SOCIAL HISTORY: Social History   Tobacco Use  . Smoking status: Never Smoker  . Smokeless tobacco: Never Used  Substance Use Topics  . Alcohol  use: Yes    Alcohol/week: 4.0 - 6.0 standard drinks    Types: 4 - 6 Standard drinks or equivalent per week  . Drug use: Never    FAMILY HISTORY: Family History  Problem Relation Age of Onset  . Diabetes Mother   . Hypertension Mother   . Obesity Mother   . Diabetes Father   . Hypertension Father   . Hyperlipidemia Father   . Heart disease Father   . Stroke Father   . Cancer Father   . Sleep apnea Father   . Obesity Father     ROS: Review of Systems  Constitutional: Positive for malaise/fatigue and weight loss.  Gastrointestinal: Negative for nausea and vomiting.  Genitourinary: Negative for frequency.  Musculoskeletal:       Negative muscle weakness  Endo/Heme/Allergies: Negative for polydipsia.       Positive polyphagia Negative hypoglycemia    PHYSICAL EXAM: Blood pressure 110/76, pulse 91, temperature 97.7 F (36.5 C), temperature source Oral, height 5\' 3"  (1.6 m), weight 185 lb (83.9 kg), SpO2 99 %. Body mass index is 32.77 kg/m.  Physical Exam Vitals signs reviewed.  Constitutional:      Appearance: Normal appearance. She is obese.  Cardiovascular:     Rate and Rhythm: Normal rate.     Pulses: Normal pulses.  Pulmonary:     Effort: Pulmonary effort is normal.     Breath sounds: Normal breath sounds.  Musculoskeletal: Normal range of motion.  Skin:    General: Skin is warm and dry.  Neurological:     Mental Status: She is alert and oriented to person, place, and time.  Psychiatric:        Mood and Affect: Mood normal.        Behavior: Behavior normal.     RECENT LABS AND TESTS: BMET    Component Value Date/Time   NA 141 03/03/2019 0850   K 4.6 03/03/2019 0850   CL 101 03/03/2019 0850   CO2 22 03/03/2019 0850   GLUCOSE 115 (H) 03/03/2019 0850   GLUCOSE 128 (H) 12/16/2017 0923   BUN 7 03/03/2019 0850   CREATININE 0.49 (L) 03/03/2019 0850   CALCIUM 9.7 03/03/2019 0850   GFRNONAA 117 03/03/2019 0850   GFRAA 135 03/03/2019 0850   No results  found for: HGBA1C Lab Results  Component Value Date   INSULIN 18.2 03/03/2019   CBC    Component Value Date/Time   WBC 7.7 12/16/2017 0923   RBC 3.98 12/16/2017 0923   HGB 10.1 (L) 12/16/2017 0923   HCT 32.3 (L) 12/16/2017 0923   PLT 366 12/16/2017 0923   MCV 81.2 12/16/2017 0923   MCH 25.4 (L) 12/16/2017 0923   MCHC 31.3 12/16/2017 0923   RDW 15.7 (H) 12/16/2017 0923   Iron/TIBC/Ferritin/ %Sat No results found for: IRON, TIBC, FERRITIN, IRONPCTSAT Lipid Panel  No results found for: CHOL, TRIG, HDL, CHOLHDL, VLDL, LDLCALC, LDLDIRECT Hepatic Function Panel     Component Value Date/Time   PROT 7.5 03/03/2019 0850   ALBUMIN 4.2 03/03/2019 0850   AST 16 03/03/2019 0850   ALT 16 03/03/2019 0850   ALKPHOS 88 03/03/2019 0850   BILITOT 0.3 03/03/2019 0850      Component Value Date/Time   TSH 1.220 03/03/2019 0850      OBESITY BEHAVIORAL INTERVENTION VISIT  Today's visit was # 2   Starting weight: 194 lbs Starting date: 03/03/2019 Today's weight : 185 lbs Today's date: 03/17/2019 Total lbs lost to date: 9    ASK: We discussed the diagnosis of obesity with Nancy Sanders today and Nancy Sanders agreed to give [place] permission to discuss obesity behavioral modification therapy today.  ASSESS: Nancy Sanders has the diagnosis of obesity and her BMI today is 32.78 Nancy Sanders is in the action stage of change   ADVISE: Nancy Sanders was educated on the multiple health risks of obesity as well as the benefit of weight loss to improve her health. She was advised of the need for long term treatment and the importance of lifestyle modifications to improve her current health and to decrease her risk of future health problems.  AGREE: Multiple dietary modification options and treatment options were discussed and  Nancy Sanders agreed to follow the recommendations documented in the above note.  ARRANGE: Nancy Sanders was educated on the importance of frequent visits to treat obesity as outlined per  CMS and USPSTF guidelines and agreed to schedule her next follow up appointment today.  I, Trixie Dredge, am acting as transcriptionist for Dennard Nip, MD  I have reviewed the above documentation for accuracy and completeness, and I agree with the above. -  Dennard Nip, MD

## 2019-04-02 ENCOUNTER — Other Ambulatory Visit: Payer: Self-pay

## 2019-04-02 ENCOUNTER — Ambulatory Visit (INDEPENDENT_AMBULATORY_CARE_PROVIDER_SITE_OTHER): Payer: 59 | Admitting: Family Medicine

## 2019-04-02 ENCOUNTER — Encounter (INDEPENDENT_AMBULATORY_CARE_PROVIDER_SITE_OTHER): Payer: Self-pay | Admitting: Family Medicine

## 2019-04-02 VITALS — BP 101/67 | HR 107 | Temp 97.9°F | Ht 63.0 in | Wt 182.0 lb

## 2019-04-02 DIAGNOSIS — Z6832 Body mass index (BMI) 32.0-32.9, adult: Secondary | ICD-10-CM | POA: Diagnosis not present

## 2019-04-02 DIAGNOSIS — E559 Vitamin D deficiency, unspecified: Secondary | ICD-10-CM

## 2019-04-02 DIAGNOSIS — Z9189 Other specified personal risk factors, not elsewhere classified: Secondary | ICD-10-CM | POA: Diagnosis not present

## 2019-04-02 DIAGNOSIS — E669 Obesity, unspecified: Secondary | ICD-10-CM

## 2019-04-02 MED ORDER — VITAMIN D (ERGOCALCIFEROL) 1.25 MG (50000 UNIT) PO CAPS: 50000.0000 [IU] | ORAL_CAPSULE | ORAL | 0 refills | Status: DC

## 2019-04-02 NOTE — Progress Notes (Signed)
Office: [phone]  /  Fax: [phone]   HPI:   Chief Complaint: OBESITY Nancy Sanders is here to discuss her progress with her obesity treatment plan. She is on the Category 3 plan and is following her eating plan approximately 90% of the time. She states she is exercising 0 minutes 0 times per week. Jareth continues to do well with weight loss. She is getting bored with breakfast and would like more options. She states her hunger is controlled and not excessive.  Her weight is 182 lb (82.6 kg) today and has had a weight loss of 3 pounds over a period of 2 weeks since her last visit. She has lost 12 lbs since starting treatment with [place].  Vitamin D deficiency Nancy Sanders has a diagnosis of Vitamin D deficiency. She is currently stable on prescription Vit D and is tolerating it well. She denies nausea, vomiting or muscle weakness.  At risk for osteopenia and osteoporosis Nancy Sanders is at higher risk of osteopenia and osteoporosis due to Vitamin D deficiency.   ASSESSMENT AND PLAN:  Vitamin D deficiency - Plan: Vitamin D, Ergocalciferol, (DRISDOL) 1.25 MG (50000 UT) CAPS capsule  At risk for osteoporosis  Class 1 obesity with serious comorbidity and body mass index (BMI) of 32.0 to 32.9 in adult, unspecified obesity type  PLAN:  Vitamin D Deficiency Nancy Sanders was informed that low Vitamin D levels contributes to fatigue and are associated with obesity, breast, and colon cancer. She agrees to continue to take prescription Vit D @ 50,000 IU every week #4 with 0 refills and will follow-up for routine testing of Vitamin D, at least 2-3 times per year. She was informed of the risk of over-replacement of Vitamin D and agrees to not increase her dose unless she discusses this with [place] first. Nancy Sanders agrees to follow-up with our clinic in 2 weeks.  At risk for osteopenia and osteoporosis Nancy Sanders was given extended  (15 minutes) osteoporosis prevention counseling today. Nancy Sanders is at risk for  osteopenia and osteoporosis due to her Vitamin D deficiency. She was encouraged to take her Vitamin D and follow her higher calcium diet and increase strengthening exercise to help strengthen her bones and decrease her risk of osteopenia and osteoporosis.  Obesity Nancy Sanders is currently in the action stage of change. As such, her goal is to continue with weight loss efforts. She has agreed to follow the Category 3 plan with breakfast options. Nancy Sanders has been instructed to work up to a goal of 150 minutes of combined cardio and strengthening exercise per week for weight loss and overall health benefits. We discussed the following Behavioral Modification Strategies today: increasing lean protein intake and decreasing simple carbohydrates.  Nancy Sanders has agreed to follow-up with our clinic in 2 weeks. She was informed of the importance of frequent follow-up visits to maximize her success with intensive lifestyle modifications for her multiple health conditions.  ALLERGIES: Allergies  Allergen Reactions  . Aspirin Other (See Comments)    Eye swelling     MEDICATIONS: Current Outpatient Medications on File Prior to Visit  Medication Sig Dispense Refill  . acetaminophen (TYLENOL) 500 MG tablet Take 1,000 mg by mouth daily as needed for moderate pain or headache.    Marland Kitchen atorvastatin (LIPITOR) 80 MG tablet Take 80 mg by mouth at bedtime.    . calcium carbonate (TUMS - DOSED IN MG ELEMENTAL CALCIUM) 500 MG chewable tablet Chew 1 tablet by mouth daily as needed for indigestion or heartburn.    . cyanocobalamin (,VITAMIN  B-12,) 1000 MCG/ML injection Inject 1,000 mcg into the muscle every 30 (thirty) days.    Marland Kitchen ezetimibe (ZETIA) 10 MG tablet Take 10 mg by mouth daily.    Marland Kitchen lisinopril (PRINIVIL,ZESTRIL) 20 MG tablet Take 30 mg by mouth every evening.   1   No current facility-administered medications on file prior to visit.     PAST MEDICAL HISTORY: Past Medical History:  Diagnosis Date  . Anemia    . Anxiety   . Constipation   . Dyspnea   . GERD (gastroesophageal reflux disease)   . Hyperlipidemia   . Hypertension   . Prediabetes   . Seasonal allergies   . SVD (spontaneous vaginal delivery)    x 2  . Vitamin B 12 deficiency     PAST SURGICAL HISTORY: Past Surgical History:  Procedure Laterality Date  . DILATATION & CURETTAGE/HYSTEROSCOPY WITH MYOSURE N/A 12/19/2017   Procedure: HYSTEROSCOPY WITH MYOSURE XL;  Surgeon: Servando Salina, MD;  Location: [facility];  Service: Gynecology;  Laterality: N/A;  . DILITATION & CURRETTAGE/HYSTROSCOPY WITH NOVASURE ABLATION N/A 12/19/2017   Procedure: DILATATION & CURETTAGE/HYSTEROSCOPY WITH NOVASURE ABLATION;  Surgeon: Servando Salina, MD;  Location: [facility];  Service: Gynecology;  Laterality: N/A;  . TUBAL LIGATION    . WISDOM TOOTH EXTRACTION      SOCIAL HISTORY: Social History   Tobacco Use  . Smoking status: Never Smoker  . Smokeless tobacco: Never Used  Substance Use Topics  . Alcohol use: Yes    Alcohol/week: 4.0 - 6.0 standard drinks    Types: 4 - 6 Standard drinks or equivalent per week  . Drug use: Never    FAMILY HISTORY: Family History  Problem Relation Age of Onset  . Diabetes Mother   . Hypertension Mother   . Obesity Mother   . Diabetes Father   . Hypertension Father   . Hyperlipidemia Father   . Heart disease Father   . Stroke Father   . Cancer Father   . Sleep apnea Father   . Obesity Father    ROS: Review of Systems  Gastrointestinal: Negative for nausea and vomiting.  Musculoskeletal:       Negative for muscle weakness.   PHYSICAL EXAM: Blood pressure 101/67, pulse (!) 107, temperature 97.9 F (36.6 C), temperature source Oral, height 5\' 3"  (1.6 m), weight 182 lb (82.6 kg), SpO2 100 %. Body mass index is 32.24 kg/m. Physical Exam Vitals signs reviewed.  Constitutional:      Appearance: Normal appearance. She is obese.  Cardiovascular:     Rate and Rhythm: Normal rate.     Pulses:  Normal pulses.  Pulmonary:     Effort: Pulmonary effort is normal.     Breath sounds: Normal breath sounds.  Musculoskeletal: Normal range of motion.  Skin:    General: Skin is warm and dry.  Neurological:     Mental Status: She is alert and oriented to person, place, and time.  Psychiatric:        Behavior: Behavior normal.   RECENT LABS AND TESTS: BMET    Component Value Date/Time   NA 141 03/03/2019 0850   K 4.6 03/03/2019 0850   CL 101 03/03/2019 0850   CO2 22 03/03/2019 0850   GLUCOSE 115 (H) 03/03/2019 0850   GLUCOSE 128 (H) 12/16/2017 0923   BUN 7 03/03/2019 0850   CREATININE 0.49 (L) 03/03/2019 0850   CALCIUM 9.7 03/03/2019 0850   GFRNONAA 117 03/03/2019 0850   GFRAA 135 03/03/2019 0850  No results found for: HGBA1C Lab Results  Component Value Date   INSULIN 18.2 03/03/2019   CBC    Component Value Date/Time   WBC 7.7 12/16/2017 0923   RBC 3.98 12/16/2017 0923   HGB 10.1 (L) 12/16/2017 0923   HCT 32.3 (L) 12/16/2017 0923   PLT 366 12/16/2017 0923   MCV 81.2 12/16/2017 0923   MCH 25.4 (L) 12/16/2017 0923   MCHC 31.3 12/16/2017 0923   RDW 15.7 (H) 12/16/2017 0923   Iron/TIBC/Ferritin/ %Sat No results found for: IRON, TIBC, FERRITIN, IRONPCTSAT Lipid Panel  No results found for: CHOL, TRIG, HDL, CHOLHDL, VLDL, LDLCALC, LDLDIRECT Hepatic Function Panel     Component Value Date/Time   PROT 7.5 03/03/2019 0850   ALBUMIN 4.2 03/03/2019 0850   AST 16 03/03/2019 0850   ALT 16 03/03/2019 0850   ALKPHOS 88 03/03/2019 0850   BILITOT 0.3 03/03/2019 0850      Component Value Date/Time   TSH 1.220 03/03/2019 0850   Results for SULEY, KINCADE (MRN [id number]) as of 04/02/2019 08:49  Ref. Range 03/03/2019 08:50  Vitamin D, 25-Hydroxy Latest Ref Range: 30.0 - 100.0 ng/mL 16.8 (L)   OBESITY BEHAVIORAL INTERVENTION VISIT  Today's visit was #3  Starting weight: 194 lbs Starting date: 03/03/2019 Today's weight: 182 lbs  Today's date: 04/02/2019  Total lbs lost to date: 12    04/02/2019  Height 5\' 3"  (1.6 m)  Weight 182 lb (82.6 kg)  BMI (Calculated) 32.25  BLOOD PRESSURE - SYSTOLIC 99991111  BLOOD PRESSURE - DIASTOLIC 67   Body Fat % 123XX123 %  Total Body Water (lbs) 73.4 lbs   ASK: We discussed the diagnosis of obesity with Nancy Sanders today and Nancy Sanders agreed to give [place] permission to discuss obesity behavioral modification therapy today.  ASSESS: Nancy Sanders has the diagnosis of obesity and her BMI today is 32.2. Nancy Sanders is in the action stage of change.   ADVISE: Nancy Sanders was educated on the multiple health risks of obesity as well as the benefit of weight loss to improve her health. She was advised of the need for long term treatment and the importance of lifestyle modifications to improve her current health and to decrease her risk of future health problems.  AGREE: Multiple dietary modification options and treatment options were discussed and  Nancy Sanders agreed to follow the recommendations documented in the above note.  ARRANGE: Nancy Sanders was educated on the importance of frequent visits to treat obesity as outlined per CMS and USPSTF guidelines and agreed to schedule her next follow up appointment today.  I, Michaelene Song, am acting as Location manager for Dennard Nip, MD  I have reviewed the above documentation for accuracy and completeness, and I agree with the above. -Dennard Nip, MD

## 2019-04-16 ENCOUNTER — Encounter (INDEPENDENT_AMBULATORY_CARE_PROVIDER_SITE_OTHER): Payer: Self-pay | Admitting: Family Medicine

## 2019-04-16 ENCOUNTER — Ambulatory Visit (INDEPENDENT_AMBULATORY_CARE_PROVIDER_SITE_OTHER): Payer: 59 | Admitting: Family Medicine

## 2019-04-16 ENCOUNTER — Other Ambulatory Visit: Payer: Self-pay

## 2019-04-16 VITALS — BP 102/66 | HR 98 | Temp 97.6°F | Ht 63.0 in | Wt 179.0 lb

## 2019-04-16 DIAGNOSIS — E559 Vitamin D deficiency, unspecified: Secondary | ICD-10-CM

## 2019-04-16 DIAGNOSIS — E7849 Other hyperlipidemia: Secondary | ICD-10-CM | POA: Diagnosis not present

## 2019-04-16 DIAGNOSIS — I10 Essential (primary) hypertension: Secondary | ICD-10-CM

## 2019-04-16 DIAGNOSIS — E538 Deficiency of other specified B group vitamins: Secondary | ICD-10-CM

## 2019-04-16 DIAGNOSIS — Z9189 Other specified personal risk factors, not elsewhere classified: Secondary | ICD-10-CM | POA: Diagnosis not present

## 2019-04-16 DIAGNOSIS — R7303 Prediabetes: Secondary | ICD-10-CM

## 2019-04-16 DIAGNOSIS — Z6831 Body mass index (BMI) 31.0-31.9, adult: Secondary | ICD-10-CM

## 2019-04-16 DIAGNOSIS — E669 Obesity, unspecified: Secondary | ICD-10-CM

## 2019-04-16 MED ORDER — VITAMIN D (ERGOCALCIFEROL) 1.25 MG (50000 UNIT) PO CAPS: 50000.0000 [IU] | ORAL_CAPSULE | ORAL | 0 refills | Status: DC

## 2019-04-20 NOTE — Progress Notes (Signed)
Office: [phone]  /  Fax: [phone]   HPI:   Chief Complaint: OBESITY Nancy Sanders is here to discuss her progress with her obesity treatment plan. She is on the Category 3 plan and is following her eating plan approximately 85-90% of the time. She states she is exercising 0 minutes 0 times per week. Nancy Sanders is getting bored with some of the meal plan. She struggles to get all of the food in each day and is making her feel guilty.  Her weight is 179 lb (81.2 kg) today and has had a weight loss of 3 pounds over a period of 2 weeks since her last visit. She has lost 15 lbs since starting treatment with [place].  Vitamin D deficiency Nancy Sanders has a diagnosis of Vitamin D deficiency. Last Vitamin D 16.8 on 03/03/2019. She is currently taking prescription Vit D and denies nausea, vomiting or muscle weakness.  At risk for osteopenia and osteoporosis [name] is at higher risk of osteopenia and osteoporosis due to Vitamin D deficiency.   Pre-Diabetes Nancy Sanders has a diagnosis of prediabetes based on her elevated Hgb A1c and was informed this puts her at greater risk of developing diabetes. She is not taking metformin currently and continues to work on diet and exercise to decrease risk of diabetes. She denies nausea or hypoglycemia.  Vitamin B12 deficiency Nancy Sanders has a diagnosis of B12 insufficiency and is taking B12 supplementation.   Hyperlipidemia Nancy Sanders has hyperlipidemia and is taking Lipitor and Zetia. She has been trying to improve her cholesterol levels with intensive lifestyle modification including a low saturated fat diet, exercise and weight loss. She denies any chest pain, claudication or myalgias.  Hypertension Nancy Sanders is a 48 y.o. female with hypertension and is on Lisinopril. Nancy Sanders denies chest pain or shortness of breath on exertion. She is working weight loss to help control her blood pressure with the goal of decreasing her risk of heart  attack and stroke. Nancy Sanders's blood pressure is 102/66.  ASSESSMENT AND PLAN:  Vitamin D deficiency - Plan: Vitamin D, Ergocalciferol, (DRISDOL) 1.25 MG (50000 UT) CAPS capsule  Prediabetes  Other hyperlipidemia  Essential hypertension  B12 nutritional deficiency  At risk for osteoporosis  Class 1 obesity with serious comorbidity and body mass index (BMI) of 31.0 to 31.9 in adult, unspecified obesity type  PLAN:  Vitamin D Deficiency Nancy Sanders was informed that low Vitamin D levels contributes to fatigue and are associated with obesity, breast, and colon cancer. She agrees to continue to take prescription Vit D @ 50,000 IU every week #4 (no refill needed) and will follow-up for routine testing of Vitamin D, at least 2-3 times per year. She was informed of the risk of over-replacement of Vitamin D and agrees to not increase her dose unless her discusses this with [place] first. Nancy Sanders agrees to follow-up with our clinic in 2 weeks.  At risk for osteopenia and osteoporosis Nancy Sanders was given extended  (15 minutes) osteoporosis prevention counseling today. Nancy Sanders is at risk for osteopenia and osteoporosis due to her Vitamin D deficiency. She was encouraged to take her Vitamin D and follow her higher calcium diet and increase strengthening exercise to help strengthen her bones and decrease her risk of osteopenia and osteoporosis.  Pre-Diabetes Nancy Sanders will continue to work on weight loss, exercise, and decreasing simple carbohydrates in her diet to help decrease the risk of diabetes. We dicussed metformin including benefits and risks. She was informed that eating too many simple carbohydrates or too  many calories at one sitting increases the likelihood of GI side effects. Nancy Sanders will follow-up in 2 weeks to monitor her progress.  Vitamin B12 deficiency Nancy Sanders will work on increasing B12 rich foods in her diet. We will plan to monitor.  Hyperlipidemia Nancy Sanders was informed of the  American Heart Association Guidelines emphasizing intensive lifestyle modifications as the first line treatment for hyperlipidemia. We discussed many lifestyle modifications today in depth, and Nancy Sanders will continue to work on decreasing saturated fats such as fatty red meat, butter and many fried foods. She will also increase vegetables and lean protein in her diet and continue to work on exercise and weight loss efforts.  Hypertension We discussed sodium restriction, working on healthy weight loss, and a regular exercise program as the means to achieve improved blood pressure control. Nancy Sanders agreed with this plan and agreed to follow up as directed. We will continue to monitor her blood pressure as well as her progress with the above lifestyle modifications. Nancy Sanders will decrease her lisinopril by one-half and will watch for signs of hypotension as she continues her lifestyle modifications.  Obesity Nancy Sanders is currently in the action stage of change. As such, her goal is to continue with weight loss efforts. She has agreed to change and follow the Category 2 plan with optional 100 calorie snack. Nancy Sanders has been instructed to exercise as tolerated for weight loss and overall health benefits. We discussed the following Behavioral Modification Strategies today: increasing lean protein intake, decreasing simple carbohydrates, increasing vegetables, increase H20 intake, and holiday eating strategies.  Nancy Sanders has agreed to follow-up with our clinic in 2 weeks. She was informed of the importance of frequent follow-up visits to maximize her success with intensive lifestyle modifications for her multiple health conditions.  ALLERGIES: Allergies  Allergen Reactions  . Aspirin Other (See Comments)    Eye swelling     MEDICATIONS: Current Outpatient Medications on File Prior to Visit  Medication Sig Dispense Refill  . atorvastatin (LIPITOR) 80 MG tablet Take 80 mg by mouth at bedtime.    .  calcium carbonate (TUMS - DOSED IN MG ELEMENTAL CALCIUM) 500 MG chewable tablet Chew 1 tablet by mouth daily as needed for indigestion or heartburn.    . cyanocobalamin (,VITAMIN B-12,) 1000 MCG/ML injection Inject 1,000 mcg into the muscle every 30 (thirty) days.    Marland Kitchen ezetimibe (ZETIA) 10 MG tablet Take 10 mg by mouth daily.    Marland Kitchen lisinopril (PRINIVIL,ZESTRIL) 20 MG tablet Take 30 mg by mouth every evening.   1   No current facility-administered medications on file prior to visit.     PAST MEDICAL HISTORY: Past Medical History:  Diagnosis Date  . Anemia   . Anxiety   . Constipation   . Dyspnea   . GERD (gastroesophageal reflux disease)   . Hyperlipidemia   . Hypertension   . Prediabetes   . Seasonal allergies   . SVD (spontaneous vaginal delivery)    x 2  . Vitamin B 12 deficiency     PAST SURGICAL HISTORY: Past Surgical History:  Procedure Laterality Date  . DILATATION & CURETTAGE/HYSTEROSCOPY WITH MYOSURE N/A 12/19/2017   Procedure: HYSTEROSCOPY WITH MYOSURE XL;  Surgeon: Servando Salina, MD;  Location: [facility];  Service: Gynecology;  Laterality: N/A;  . DILITATION & CURRETTAGE/HYSTROSCOPY WITH NOVASURE ABLATION N/A 12/19/2017   Procedure: DILATATION & CURETTAGE/HYSTEROSCOPY WITH NOVASURE ABLATION;  Surgeon: Servando Salina, MD;  Location: [facility];  Service: Gynecology;  Laterality: N/A;  . TUBAL LIGATION    .  WISDOM TOOTH EXTRACTION      SOCIAL HISTORY: Social History   Tobacco Use  . Smoking status: Never Smoker  . Smokeless tobacco: Never Used  Substance Use Topics  . Alcohol use: Yes    Alcohol/week: 4.0 - 6.0 standard drinks    Types: 4 - 6 Standard drinks or equivalent per week  . Drug use: Never    FAMILY HISTORY: Family History  Problem Relation Age of Onset  . Diabetes Mother   . Hypertension Mother   . Obesity Mother   . Diabetes Father   . Hypertension Father   . Hyperlipidemia Father   . Heart disease Father   . Stroke Father   . Cancer  Father   . Sleep apnea Father   . Obesity Father    ROS: Review of Systems  Respiratory: Negative for shortness of breath.   Cardiovascular: Negative for chest pain and claudication.  Gastrointestinal: Negative for nausea and vomiting.  Musculoskeletal: Negative for myalgias.       Negative for muscle weakness.  Endo/Heme/Allergies:       Negative for hypoglycemia.   PHYSICAL EXAM: Blood pressure 102/66, pulse 98, temperature 97.6 F (36.4 C), temperature source Oral, height 5\' 3"  (1.6 m), weight 179 lb (81.2 kg), SpO2 100 %. Body mass index is 31.71 kg/m. Physical Exam Vitals signs reviewed.  Constitutional:      Appearance: Normal appearance. She is obese.  Cardiovascular:     Rate and Rhythm: Normal rate.     Pulses: Normal pulses.  Pulmonary:     Effort: Pulmonary effort is normal.     Breath sounds: Normal breath sounds.  Musculoskeletal: Normal range of motion.  Skin:    General: Skin is warm and dry.  Neurological:     Mental Status: She is alert and oriented to person, place, and time.  Psychiatric:        Behavior: Behavior normal.   RECENT LABS AND TESTS: BMET    Component Value Date/Time   NA 141 03/03/2019 0850   K 4.6 03/03/2019 0850   CL 101 03/03/2019 0850   CO2 22 03/03/2019 0850   GLUCOSE 115 (H) 03/03/2019 0850   GLUCOSE 128 (H) 12/16/2017 0923   BUN 7 03/03/2019 0850   CREATININE 0.49 (L) 03/03/2019 0850   CALCIUM 9.7 03/03/2019 0850   GFRNONAA 117 03/03/2019 0850   GFRAA 135 03/03/2019 0850   No results found for: HGBA1C Lab Results  Component Value Date   INSULIN 18.2 03/03/2019   CBC    Component Value Date/Time   WBC 7.7 12/16/2017 0923   RBC 3.98 12/16/2017 0923   HGB 10.1 (L) 12/16/2017 0923   HCT 32.3 (L) 12/16/2017 0923   PLT 366 12/16/2017 0923   MCV 81.2 12/16/2017 0923   MCH 25.4 (L) 12/16/2017 0923   MCHC 31.3 12/16/2017 0923   RDW 15.7 (H) 12/16/2017 0923   Iron/TIBC/Ferritin/ %Sat No results found for: IRON,  TIBC, FERRITIN, IRONPCTSAT Lipid Panel  No results found for: CHOL, TRIG, HDL, CHOLHDL, VLDL, LDLCALC, LDLDIRECT Hepatic Function Panel     Component Value Date/Time   PROT 7.5 03/03/2019 0850   ALBUMIN 4.2 03/03/2019 0850   AST 16 03/03/2019 0850   ALT 16 03/03/2019 0850   ALKPHOS 88 03/03/2019 0850   BILITOT 0.3 03/03/2019 0850      Component Value Date/Time   TSH 1.220 03/03/2019 0850   Results for HARLEN, BOUSE (MRN [id number]) as of 04/20/2019 09:10  Ref. Range 03/03/2019  08:50  Vitamin D, 25-Hydroxy Latest Ref Range: 30.0 - 100.0 ng/mL 16.8 (L)   OBESITY BEHAVIORAL INTERVENTION VISIT  Today's visit was #4  Starting weight: 194 lbs Starting date: 03/03/2019 Today's weight: 179 lbs  Today's date: 04/16/2019 Total lbs lost to date: 15     04/16/2019  Height 5\' 3"  (1.6 m)  Weight 179 lb (81.2 kg)  BMI (Calculated) 31.72  BLOOD PRESSURE - SYSTOLIC [id number]  BLOOD PRESSURE - DIASTOLIC 66   Body Fat % 99991111 %  Total Body Water (lbs) 72.6 lbs   ASK: We discussed the diagnosis of obesity with Lavone Nian Sanders today and Mackynzie agreed to give [place] permission to discuss obesity behavioral modification therapy today.  ASSESS: Georga has the diagnosis of obesity and her BMI today is 31.8. Erik is in the action stage of change.   ADVISE: Kruti was educated on the multiple health risks of obesity as well as the benefit of weight loss to improve her health. She was advised of the need for long term treatment and the importance of lifestyle modifications to improve her current health and to decrease her risk of future health problems.  AGREE: Multiple dietary modification options and treatment options were discussed and  Ivah agreed to follow the recommendations documented in the above note.  ARRANGE: Miles was educated on the importance of frequent visits to treat obesity as outlined per CMS and USPSTF guidelines and agreed to schedule her next follow  up appointment today.  IMichaelene Song, am acting as Location manager for [employer]. Juleen China, DO  I have reviewed the above documentation for accuracy and completeness, and I agree with the above. Briscoe Deutscher, DO

## 2019-05-07 ENCOUNTER — Other Ambulatory Visit: Payer: Self-pay

## 2019-05-07 ENCOUNTER — Encounter (INDEPENDENT_AMBULATORY_CARE_PROVIDER_SITE_OTHER): Payer: Self-pay | Admitting: Family Medicine

## 2019-05-07 ENCOUNTER — Ambulatory Visit (INDEPENDENT_AMBULATORY_CARE_PROVIDER_SITE_OTHER): Payer: 59 | Admitting: Family Medicine

## 2019-05-07 VITALS — BP 133/74 | HR 95 | Temp 97.9°F | Ht 63.0 in | Wt 175.0 lb

## 2019-05-07 DIAGNOSIS — E559 Vitamin D deficiency, unspecified: Secondary | ICD-10-CM

## 2019-05-07 DIAGNOSIS — Z9189 Other specified personal risk factors, not elsewhere classified: Secondary | ICD-10-CM | POA: Diagnosis not present

## 2019-05-07 DIAGNOSIS — E7849 Other hyperlipidemia: Secondary | ICD-10-CM | POA: Diagnosis not present

## 2019-05-07 DIAGNOSIS — E538 Deficiency of other specified B group vitamins: Secondary | ICD-10-CM | POA: Diagnosis not present

## 2019-05-07 DIAGNOSIS — R7303 Prediabetes: Secondary | ICD-10-CM | POA: Diagnosis not present

## 2019-05-07 DIAGNOSIS — I10 Essential (primary) hypertension: Secondary | ICD-10-CM

## 2019-05-07 MED ORDER — VITAMIN D (ERGOCALCIFEROL) 1.25 MG (50000 UNIT) PO CAPS: 50000.0000 [IU] | ORAL_CAPSULE | ORAL | 0 refills | Status: DC

## 2019-05-11 NOTE — Progress Notes (Signed)
Office: [phone]  /  Fax: [phone]   HPI:  Chief Complaint: OBESITY Nancy Sanders is here to discuss her progress with her obesity treatment plan. She is on the Category 2 plan with optional 100 calorie snack and states she is following her eating plan approximately 75-80 % of the time. She states she is exercising 0 minutes 0 times per week.  Nancy Sanders is doing well and she was able to stick to the plan during the holiday. She decreased to Category 2 at her last visit and she is able to tolerate it better. She misses hotdogs and would like more cheese on her taco salad. She is not exercising yet but she is hoping to get back to running early next year. She has a history of plantar fascitis, tendonitis, and shin splints. No intervention then. Endorses some shortness of breath with running from deconditioning and extra weight.  Today's visit was # 5  Starting weight: 194 lbs Starting date: 03/03/2019 Today's weight : 175 lbs Today's date: 05/07/2019 Total lbs lost to date: 19 Total lbs lost since last in-office visit: [place] has a diagnosis of pre-diabetes. Her A1c was previously high, and her most recent insulin level was 18.2 on 03/03/2019.   At risk for diabetes Nancy Sanders is at higher than average risk for developing diabetes due to her obesity and pre-diabetes.   Hyperlipidemia Nancy Sanders has a diagnosis of hyperlipidemia. She is taking Lipitor and Zetia, and she denies myalgias. Previous labs were reviewed.  Hypertension Nancy Sanders has a diagnosis of hypertension. She is taking lisinopril. Her blood pressure is elevated at 133/74 today, but also with elevated pulse of 95. She denies chest pain, shortness of breath, edema, or palpitations.  Vitamin B12 Deficiency Nancy Sanders has a diagnosis of B12 deficiency. Her last Vit B12 level was 349 on 03/03/2019. She is currently getting B12 injections monthly, usually at the end of the month.  Vitamin D Deficiency Nancy Sanders  has a diagnosis of vitamin D deficiency. Last Vit D level was 16.8 on 03/03/2019. She is tolerating prescription Vit D supplement without concerns.  ASSESSMENT AND PLAN:  Prediabetes  Other hyperlipidemia  Essential hypertension  B12 nutritional deficiency  Vitamin D deficiency - Plan: Vitamin D, Ergocalciferol, (DRISDOL) 1.25 MG (50000 UT) CAPS capsule  At risk for diabetes mellitus  PLAN:  Pre-Diabetes Nancy Sanders will continue to work on weight loss, exercise, and decreasing simple carbohydrates to help decrease the risk of diabetes. We will recheck labs in 1-2 months.   Diabetes risk counseling (~15 min) Nancy Sanders is a 48 y.o. female and has risk factors for diabetes including obesity and pre-diabetes. We discussed intensive lifestyle modifications today with an emphasis on weight loss as well as increasing exercise and decreasing simple carbohydrates in her diet.  Hyperlipidemia Intensive lifestyle modifications as the first line treatment for hyperlipidemia. We discussed many lifestyle modifications today and Nancy Sanders will continue to work on diet, exercise and weight loss efforts. [name] will continue her current treatment, and we will recheck labs in 1-2 months.  Hypertension Nancy Sanders is working on healthy weight loss and exercise to improve blood pressure control. We will continue to monitor and watch for signs of hypotension as she continues her lifestyle modifications.  Vitamin B12 Deficiency Nancy Sanders will work on increasing B12 rich foods in her diet. We will recheck labs in 1-2 months (just before her next B12 injection).  Vitamin D Deficiency Low vitamin D level contributes to fatigue and are associated with obesity, breast, and colon cancer.  Nancy Sanders agrees to continue taking prescription Vit D 50,000 IU every week #4 and we will refill for 1 month. She will follow up for routine testing of vitamin D, at least 2-3 times per year to avoid over-replacement. Nancy Sanders agrees  to follow up with our clinic in 2 weeks.  Obesity Nancy Sanders is currently in the action stage of change. As such, her goal is to continue with weight loss efforts. She has agreed to follow the Category 2 plan.  Nancy Sanders has been instructed to work up to a goal of 150 minutes of combined cardio and strengthening exercise per week or consider HIIT for 15 minutes 7 times per week for weight loss and overall health benefits.  We discussed the following Behavioral Modification Strategies today: increasing lean protein intake, decreasing simple carbohydrates, increasing vegetables, increase H20 intake, and holiday eating strategies   Nancy Sanders has agreed to follow up with our clinic in 2 weeks. She was informed of the importance of frequent follow up visits to maximize her success with intensive lifestyle modifications for her multiple health conditions.  ALLERGIES: Allergies  Allergen Reactions  . Aspirin Other (See Comments)    Eye swelling    MEDICATIONS: Current Outpatient Medications on File Prior to Visit  Medication Sig Dispense Refill  . atorvastatin (LIPITOR) 80 MG tablet Take 80 mg by mouth at bedtime.    . calcium carbonate (TUMS - DOSED IN MG ELEMENTAL CALCIUM) 500 MG chewable tablet Chew 1 tablet by mouth daily as needed for indigestion or heartburn.    . cyanocobalamin (,VITAMIN B-12,) 1000 MCG/ML injection Inject 1,000 mcg into the muscle every 30 (thirty) days.    Marland Kitchen ezetimibe (ZETIA) 10 MG tablet Take 10 mg by mouth daily.    Marland Kitchen lisinopril (PRINIVIL,ZESTRIL) 20 MG tablet Take 30 mg by mouth every evening.   1   No current facility-administered medications on file prior to visit.   PAST MEDICAL HISTORY: Past Medical History:  Diagnosis Date  . Anemia   . Anxiety   . Constipation   . Dyspnea   . GERD (gastroesophageal reflux disease)   . Hyperlipidemia   . Hypertension   . Prediabetes   . Seasonal allergies   . SVD (spontaneous vaginal delivery)    x 2  . Vitamin B 12  deficiency    PAST SURGICAL HISTORY: Past Surgical History:  Procedure Laterality Date  . DILATATION & CURETTAGE/HYSTEROSCOPY WITH MYOSURE N/A 12/19/2017   Procedure: HYSTEROSCOPY WITH MYOSURE XL;  Surgeon: Servando Salina, MD;  Location: [facility];  Service: Gynecology;  Laterality: N/A;  . DILITATION & CURRETTAGE/HYSTROSCOPY WITH NOVASURE ABLATION N/A 12/19/2017   Procedure: DILATATION & CURETTAGE/HYSTEROSCOPY WITH NOVASURE ABLATION;  Surgeon: Servando Salina, MD;  Location: [facility];  Service: Gynecology;  Laterality: N/A;  . TUBAL LIGATION    . WISDOM TOOTH EXTRACTION     SOCIAL HISTORY: Social History   Tobacco Use  . Smoking status: Never Smoker  . Smokeless tobacco: Never Used  Substance Use Topics  . Alcohol use: Yes    Alcohol/week: 4.0 - 6.0 standard drinks    Types: 4 - 6 Standard drinks or equivalent per week  . Drug use: Never   FAMILY HISTORY: Family History  Problem Relation Age of Onset  . Diabetes Mother   . Hypertension Mother   . Obesity Mother   . Diabetes Father   . Hypertension Father   . Hyperlipidemia Father   . Heart disease Father   . Stroke Father   . Cancer  Father   . Sleep apnea Father   . Obesity Father    ROS: Review of Systems  Constitutional: Positive for weight loss.  Respiratory: Negative for shortness of breath.   Cardiovascular: Negative for chest pain and palpitations.       Negative edema  Musculoskeletal: Negative for myalgias.   PHYSICAL EXAM: Blood pressure 133/74, pulse 95, temperature 97.9 F (36.6 C), temperature source Oral, height 5\' 3"  (1.6 m), weight 175 lb (79.4 kg), SpO2 99 %. Body mass index is 31 kg/m.   Physical Exam Vitals reviewed.  Constitutional:      Appearance: Normal appearance. She is obese.  Cardiovascular:     Rate and Rhythm: Normal rate.     Pulses: Normal pulses.  Pulmonary:     Effort: Pulmonary effort is normal.     Breath sounds: Normal breath sounds.  Musculoskeletal:         General: Normal range of motion.  Skin:    General: Skin is warm and dry.  Neurological:     Mental Status: She is alert and oriented to person, place, and time.  Psychiatric:        Mood and Affect: Mood normal.        Behavior: Behavior normal.    RECENT LABS AND TESTS: BMET    Component Value Date/Time   NA 141 03/03/2019 0850   K 4.6 03/03/2019 0850   CL 101 03/03/2019 0850   CO2 22 03/03/2019 0850   GLUCOSE 115 (H) 03/03/2019 0850   GLUCOSE 128 (H) 12/16/2017 0923   BUN 7 03/03/2019 0850   CREATININE 0.49 (L) 03/03/2019 0850   CALCIUM 9.7 03/03/2019 0850   GFRNONAA 117 03/03/2019 0850   GFRAA 135 03/03/2019 0850   No results found for: HGBA1C Lab Results  Component Value Date   INSULIN 18.2 03/03/2019   CBC    Component Value Date/Time   WBC 7.7 12/16/2017 0923   RBC 3.98 12/16/2017 0923   HGB 10.1 (L) 12/16/2017 0923   HCT 32.3 (L) 12/16/2017 0923   PLT 366 12/16/2017 0923   MCV 81.2 12/16/2017 0923   MCH 25.4 (L) 12/16/2017 0923   MCHC 31.3 12/16/2017 0923   RDW 15.7 (H) 12/16/2017 0923   Iron/TIBC/Ferritin/ %Sat No results found for: IRON, TIBC, FERRITIN, IRONPCTSAT Lipid Panel  No results found for: CHOL, TRIG, HDL, CHOLHDL, VLDL, LDLCALC, LDLDIRECT Hepatic Function Panel     Component Value Date/Time   PROT 7.5 03/03/2019 0850   ALBUMIN 4.2 03/03/2019 0850   AST 16 03/03/2019 0850   ALT 16 03/03/2019 0850   ALKPHOS 88 03/03/2019 0850   BILITOT 0.3 03/03/2019 0850      Component Value Date/Time   TSH 1.220 03/03/2019 0850   I, Trixie Dredge, am acting as transcriptionist for Briscoe Deutscher, DO  I have reviewed the above documentation for accuracy and completeness, and I agree with the above. Briscoe Deutscher, DO

## 2019-05-12 ENCOUNTER — Encounter (INDEPENDENT_AMBULATORY_CARE_PROVIDER_SITE_OTHER): Payer: Self-pay | Admitting: Family Medicine

## 2019-06-02 ENCOUNTER — Encounter (INDEPENDENT_AMBULATORY_CARE_PROVIDER_SITE_OTHER): Payer: Self-pay | Admitting: Family Medicine

## 2019-06-02 ENCOUNTER — Ambulatory Visit (INDEPENDENT_AMBULATORY_CARE_PROVIDER_SITE_OTHER): Payer: 59 | Admitting: Family Medicine

## 2019-06-02 ENCOUNTER — Other Ambulatory Visit: Payer: Self-pay

## 2019-06-02 VITALS — BP 104/71 | HR 96 | Temp 98.0°F | Ht 63.0 in | Wt 169.0 lb

## 2019-06-02 DIAGNOSIS — E559 Vitamin D deficiency, unspecified: Secondary | ICD-10-CM | POA: Diagnosis not present

## 2019-06-02 DIAGNOSIS — R739 Hyperglycemia, unspecified: Secondary | ICD-10-CM

## 2019-06-02 DIAGNOSIS — E669 Obesity, unspecified: Secondary | ICD-10-CM | POA: Diagnosis not present

## 2019-06-02 DIAGNOSIS — Z9189 Other specified personal risk factors, not elsewhere classified: Secondary | ICD-10-CM | POA: Diagnosis not present

## 2019-06-02 DIAGNOSIS — Z6830 Body mass index (BMI) 30.0-30.9, adult: Secondary | ICD-10-CM

## 2019-06-03 NOTE — Progress Notes (Signed)
Chief Complaint: OBESITY Nancy Sanders is here to discuss her progress with her obesity treatment plan along with follow-up of her obesity related diagnoses. Nancy Sanders is on the Category 2 Plan and states she is following her eating plan approximately 60% of the time. Nancy Sanders states she is doing 0 minutes 0 times per week.  Today's visit was #: 6 Starting weight: 194 lbs Starting date: 03/03/2019 Today's weight: 169 lbs Today's date: 06/02/2019 Total lbs lost to date: 25 Total lbs lost since last in-office visit: 6  Interim History: Nancy Sanders did well with weight loss over the holidays, but she didn't feel well and didn't eat much. She is feeling better and is back to her plan, but she is feeling a bit bored with her plan.  Subjective:   1. Hyperglycemia Nancy Sanders's fasting glucose was elevated last labs. She has done well with weight loss and her hunger has improved. I discussed labs with the patient today.  2. Vitamin D deficiency Nancy Sanders is stable on Vit D prescription, but level is not yet at goal. She is due to have labs rechecked soon. I discussed labs with the patient today.  3. At risk for diabetes mellitus Nancy Sanders is at higher than average risk for developing diabetes due to her obesity.   Assessment/Plan:   1. Hyperglycemia Nancy Sanders will continue with diet and exercise, and we will recheck labs in 3 weeks.  2. Vitamin D deficiency Nancy Sanders will continue taking Vit D prescription and we will recheck labs in 3 weeks.  3. At risk for diabetes mellitus Nancy Sanders is a 49 y.o. female and has risk factors for diabetes including obesity. We discussed intensive lifestyle modifications today with an emphasis on weight loss as well as increasing exercise and decreasing simple carbohydrates in her diet. (15 minutes)  4. Class 1 obesity with serious comorbidity and body mass index (BMI) of 30.0 to 30.9 in adult, unspecified obesity type Nancy Sanders is currently in the action  stage of change. As such, her goal is to continue with weight loss efforts. She has agreed to Category 2 Plan or [place].   We discussed the following exercise goals today: For substantial health benefits, adults should do at least 150 minutes (2 hours and 30 minutes) a week of moderate-intensity, or 75 minutes (1 hour and 15 minutes) a week of vigorous-intensity aerobic physical activity, or an equivalent combination of moderate- and vigorous-intensity aerobic activity. Aerobic activity should be performed in episodes of at least 10 minutes, and preferably, it should be spread throughout the week. Adults should also include muscle-strengthening activities that involve all major muscle groups on 2 or more days a week.  We discussed the following behavioral modification strategies today: no skipping meals.  Nancy Sanders has agreed to follow-up with our clinic in 3 weeks. She was informed of the importance of frequent follow-up visits to maximize her success with intensive lifestyle modifications for her multiple health conditions.  Objective:   Blood pressure 104/71, pulse 96, temperature 98 F (36.7 C), temperature source Oral, height 5\' 3"  (1.6 m), weight 169 lb (76.7 kg), SpO2 97 %. Body mass index is 29.94 kg/m.  General: Cooperative, alert, well developed, in no acute distress. HEENT: Conjunctivae and lids unremarkable. Neck: No thyromegaly.  Cardiovascular: Regular rhythm.  Lungs: Normal work of breathing. Extremities: No edema.  Neurologic: No focal deficits.   Lab Results  Component Value Date   CREATININE 0.49 (L) 03/03/2019   BUN 7 03/03/2019   NA  141 03/03/2019   K 4.6 03/03/2019   CL 101 03/03/2019   CO2 22 03/03/2019   Lab Results  Component Value Date   ALT 16 03/03/2019   AST 16 03/03/2019   ALKPHOS 88 03/03/2019   BILITOT 0.3 03/03/2019   No results found for: HGBA1C Lab Results  Component Value Date   INSULIN 18.2 03/03/2019   Lab Results   Component Value Date   TSH 1.220 03/03/2019   No results found for: CHOL, HDL, LDLCALC, LDLDIRECT, TRIG, CHOLHDL Lab Results  Component Value Date   WBC 7.7 12/16/2017   HGB 10.1 (L) 12/16/2017   HCT 32.3 (L) 12/16/2017   MCV 81.2 12/16/2017   PLT 366 12/16/2017   No results found for: IRON, TIBC, FERRITIN  Attestation Statements:   Reviewed by clinician on day of visit: allergies, medications, problem list, medical history, surgical history, family history, social history and previous encounter notes.  This visit occurred during the SARS-CoV-2 public health emergency. Safety protocols were in place, including screening questions prior to the visit, additional usage of staff PPE, and extensive cleaning of exam room while observing appropriate contact time as indicated for disinfecting solutions. (CPT [id number])  I, Trixie Dredge, am acting as transcriptionist for Dennard Nip, MD.  I have reviewed the above documentation for accuracy and completeness, and I agree with the above. -  Dennard Nip, MD

## 2019-06-22 ENCOUNTER — Encounter (INDEPENDENT_AMBULATORY_CARE_PROVIDER_SITE_OTHER): Payer: Self-pay | Admitting: Family Medicine

## 2019-06-22 ENCOUNTER — Ambulatory Visit (INDEPENDENT_AMBULATORY_CARE_PROVIDER_SITE_OTHER): Payer: 59 | Admitting: Family Medicine

## 2019-06-22 ENCOUNTER — Other Ambulatory Visit: Payer: Self-pay

## 2019-06-22 VITALS — BP 95/64 | HR 70 | Temp 97.9°F | Ht 63.0 in | Wt 168.0 lb

## 2019-06-22 DIAGNOSIS — E669 Obesity, unspecified: Secondary | ICD-10-CM

## 2019-06-22 DIAGNOSIS — E7849 Other hyperlipidemia: Secondary | ICD-10-CM

## 2019-06-22 DIAGNOSIS — R7303 Prediabetes: Secondary | ICD-10-CM

## 2019-06-22 DIAGNOSIS — Z6830 Body mass index (BMI) 30.0-30.9, adult: Secondary | ICD-10-CM

## 2019-06-22 DIAGNOSIS — I10 Essential (primary) hypertension: Secondary | ICD-10-CM | POA: Diagnosis not present

## 2019-06-22 DIAGNOSIS — Z9189 Other specified personal risk factors, not elsewhere classified: Secondary | ICD-10-CM

## 2019-06-22 DIAGNOSIS — E559 Vitamin D deficiency, unspecified: Secondary | ICD-10-CM

## 2019-06-22 DIAGNOSIS — E538 Deficiency of other specified B group vitamins: Secondary | ICD-10-CM

## 2019-06-22 MED ORDER — VITAMIN D (ERGOCALCIFEROL) 1.25 MG (50000 UNIT) PO CAPS: 50000.0000 [IU] | ORAL_CAPSULE | ORAL | 0 refills | Status: DC

## 2019-06-22 NOTE — Progress Notes (Signed)
Chief Complaint:   OBESITY Nancy Sanders is here to discuss her progress with her obesity treatment plan along with follow-up of her obesity related diagnoses. Nancy Sanders is on the Category 2 Plan and states she is following her eating plan approximately 60-70% of the time. Nancy Sanders states she is walking for 30 minutes 2-3 times per week.  Today's visit was #: 6 Starting weight: 194 lbs Starting date: 03/03/2019 Today's weight: 168 lbs Today's date: 06/22/2019 Total lbs lost to date: 26 lbs Total lbs lost since last in-office visit: 1 lb  Interim History: Nancy Sanders has been off of the plan more over the past few weeks but still making better choices.  She says she struggles with craving salty food while making dinner and will eat pretzels or crackers.  Subjective:   1. Prediabetes Nancy Sanders has a diagnosis of prediabetes based on her elevated HgA1c and was informed this puts her at greater risk of developing diabetes. She continues to work on diet and exercise to decrease her risk of diabetes. She denies nausea or hypoglycemia.  No results found for: HGBA1C Lab Results  Component Value Date   INSULIN 18.2 03/03/2019   2. Other hyperlipidemia Nancy Sanders has hyperlipidemia and has been trying to improve her cholesterol levels with intensive lifestyle modification including a low saturated fat diet, exercise and weight loss. She denies any chest pain, claudication or myalgias.  She is taking Lipitor and Zetia.  Lab Results  Component Value Date   CHOL 187 06/22/2019   HDL 51 06/22/2019   LDLCALC 112 (H) 06/22/2019   TRIG 137 06/22/2019   Lab Results  Component Value Date   ALT 16 03/03/2019   AST 16 03/03/2019   ALKPHOS 88 03/03/2019   BILITOT 0.3 03/03/2019   3. Essential hypertension Review: taking medications as instructed, no medication side effects noted. She is taking lisinopril for blood pressure control.  BP Readings from Last 3 Encounters:  06/22/19 95/64  06/02/19  104/71  05/07/19 133/74   4. Vitamin D deficiency Nancy Sanders's Vitamin D level was 16.8 on 03/03/2019. She is currently taking vit D. She denies nausea, vomiting or muscle weakness.  5. B12 nutritional deficiency Nancy Sanders is currently receiving monthly vitamin B12 injections.  She had her last injection at the end of December (2 weeks ago).   Lab Results  Component Value Date   VITAMINB12 349 03/03/2019   6. At risk for diabetes mellitus Nancy Sanders is at higher than average risk for developing diabetes due to her obesity.   Assessment/Plan:   1. Prediabetes Nancy Sanders will continue to work on weight loss, exercise, and decreasing simple carbohydrates to help decrease the risk of diabetes.   Orders - Hemoglobin A1c - Insulin, random - Comprehensive metabolic panel - CBC with Differential/Platelet  2. Other hyperlipidemia Cardiovascular risk and specific lipid/LDL goals reviewed.  We discussed several lifestyle modifications today and Nancy Sanders will continue to work on diet, exercise and weight loss efforts. Orders and follow up as documented in patient record.   Counseling Intensive lifestyle modifications are the first line treatment for this issue. . Dietary changes: Increase soluble fiber. Decrease simple carbohydrates. . Exercise changes: Moderate to vigorous-intensity aerobic activity 150 minutes per week if tolerated. . Lipid-lowering medications: see documented in medical record.  Orders - Lipid Panel With LDL/HDL Ratio  3. Essential hypertension Nancy Sanders is working on healthy weight loss and exercise to improve blood pressure control. We will watch for signs of hypotension as she continues her lifestyle modifications.  4. Vitamin D deficiency Low Vitamin D level contributes to fatigue and are associated with obesity, breast, and colon cancer. She agrees to continue to take prescription Vitamin D @50 ,000 IU every week and will follow-up for routine testing of Vitamin D, at  least 2-3 times per year to avoid over-replacement.  Orders - VITAMIN D 25 Hydroxy (Vit-D Deficiency, Fractures) - Vitamin D, Ergocalciferol, (DRISDOL) 1.25 MG (50000 UNIT) CAPS capsule; Take 1 capsule (50,000 Units total) by mouth every 7 (seven) days.  Dispense: 4 capsule; Refill: 0  5. B12 nutritional deficiency The diagnosis was reviewed with the patient. Counseling provided today, see below. We will continue to monitor. Orders and follow up as documented in patient record.  Counseling . The body needs vitamin B12: to make red blood cells; to make DNA; and to help the nerves work properly so they can carry messages from the brain to the body.  . The main causes of vitamin B12 deficiency include dietary deficiency, digestive diseases, pernicious anemia, and having a surgery in which part of the stomach or small intestine is removed.  . Certain medicines can make it harder for the body to absorb vitamin B12. These medicines include: heartburn medications; some antibiotics; some medications used to treat diabetes, gout, and high cholesterol.  . In some cases, there are no symptoms of this condition. If the condition leads to anemia or nerve damage, various symptoms can occur, such as weakness or fatigue, shortness of breath, and numbness or tingling in your hands and feet.   . Treatment:  o May include taking vitamin B12 supplements.  o Avoid alcohol.  o Eat lots of healthy foods that contain vitamin B12: - Beef, pork, chicken, [place], and organ meats, such as liver.  - Seafood: This includes clams, rainbow trout, salmon, tuna, and haddock. Eggs.  - Cereal and dairy products that are fortified: This means that vitamin B12 has been added to the food.   Orders - Vitamin B12  6. At risk for diabetes mellitus Nancy Sanders was given approximately 15 minutes of diabetes education and counseling today. We discussed intensive lifestyle modifications today with an emphasis on weight loss as well as  increasing exercise and decreasing simple carbohydrates in her diet. We also reviewed medication options with an emphasis on risk versus benefit of those discussed.   Repetitive spaced learning was employed today to elicit superior memory formation and behavioral change.  7. Class 1 obesity with serious comorbidity and body mass index (BMI) of 30.0 to 30.9 in adult, unspecified obesity type Nancy Sanders is currently in the action stage of change. As such, her goal is to continue with weight loss efforts. She has agreed to the Category 2 Plan.   Exercise goals: For substantial health benefits, adults should do at least 150 minutes (2 hours and 30 minutes) a week of moderate-intensity, or 75 minutes (1 hour and 15 minutes) a week of vigorous-intensity aerobic physical activity, or an equivalent combination of moderate- and vigorous-intensity aerobic activity. Aerobic activity should be performed in episodes of at least 10 minutes, and preferably, it should be spread throughout the week. Adults should also include muscle-strengthening activities that involve all major muscle groups on 2 or more days a week.  Behavioral modification strategies:  Increase water prior to dinner, consider Metamucil, try vegetables or fruit.  Nancy Sanders has agreed to follow-up with our clinic in 2 weeks. She was informed of the importance of frequent follow-up visits to maximize her success with intensive lifestyle modifications for her  multiple health conditions.   Nancy Sanders was informed we would discuss her lab results at her next visit unless there is a critical issue that needs to be addressed sooner. Nancy Sanders agreed to keep her next visit at the agreed upon time to discuss these results.  Objective:   Blood pressure 95/64, pulse 70, temperature 97.9 F (36.6 C), temperature source Oral, height 5\' 3"  (1.6 m), weight 168 lb (76.2 kg), SpO2 98 %. Body mass index is 29.76 kg/m.  General: Cooperative, alert, well developed,  in no acute distress. HEENT: Conjunctivae and lids unremarkable. Cardiovascular: Regular rhythm.  Lungs: Normal work of breathing. Neurologic: No focal deficits.   Lab Results  Component Value Date   CREATININE 0.49 (L) 03/03/2019   BUN 7 03/03/2019   NA 141 03/03/2019   K 4.6 03/03/2019   CL 101 03/03/2019   CO2 22 03/03/2019   Lab Results  Component Value Date   ALT 16 03/03/2019   AST 16 03/03/2019   ALKPHOS 88 03/03/2019   BILITOT 0.3 03/03/2019   Lab Results  Component Value Date   INSULIN 18.2 03/03/2019   Lab Results  Component Value Date   TSH 1.220 03/03/2019   Lab Results  Component Value Date   WBC 7.7 12/16/2017   HGB 10.1 (L) 12/16/2017   HCT 32.3 (L) 12/16/2017   MCV 81.2 12/16/2017   PLT 366 12/16/2017   Attestation Statements:   Reviewed by clinician on day of visit: allergies, medications, problem list, medical history, surgical history, family history, social history, and previous encounter notes.  I, Water quality scientist, CMA, am acting as Location manager for [employer], DO.  I have reviewed the above documentation for accuracy and completeness, and I agree with the above. Briscoe Deutscher, DO

## 2019-06-23 LAB — CBC WITH DIFFERENTIAL/PLATELET
Basophils Absolute: 0 10*3/uL (ref 0.0–0.2)
Basos: 0 %
EOS (ABSOLUTE): 0.1 10*3/uL (ref 0.0–0.4)
Eos: 2 %
Hematocrit: 38.1 % (ref 34.0–46.6)
Hemoglobin: 12.2 g/dL (ref 11.1–15.9)
Immature Grans (Abs): 0 10*3/uL (ref 0.0–0.1)
Immature Granulocytes: 0 %
Lymphocytes Absolute: 1.8 10*3/uL (ref 0.7–3.1)
Lymphs: 26 %
MCH: 29.5 pg (ref 26.6–33.0)
MCHC: 32 g/dL (ref 31.5–35.7)
MCV: 92 fL (ref 79–97)
Monocytes Absolute: 0.3 10*3/uL (ref 0.1–0.9)
Monocytes: 4 %
Neutrophils Absolute: 4.7 10*3/uL (ref 1.4–7.0)
Neutrophils: 68 %
Platelets: 284 10*3/uL (ref 150–450)
RBC: 4.14 x10E6/uL (ref 3.77–5.28)
RDW: 13.5 % (ref 11.7–15.4)
WBC: 6.9 10*3/uL (ref 3.4–10.8)

## 2019-06-23 LAB — LIPID PANEL WITH LDL/HDL RATIO
Cholesterol, Total: 187 mg/dL (ref 100–199)
HDL: 51 mg/dL (ref >39)
LDL Chol Calc (NIH): 112 mg/dL — ABNORMAL HIGH (ref 0–99)
LDL/HDL Ratio: 2.2 ratio (ref 0.0–3.2)
Triglycerides: 137 mg/dL (ref 0–149)
VLDL Cholesterol Cal: 24 mg/dL (ref 5–40)

## 2019-06-23 LAB — COMPREHENSIVE METABOLIC PANEL
ALT: 14 IU/L (ref 0–32)
AST: 15 IU/L (ref 0–40)
Albumin/Globulin Ratio: 1.4 (ref 1.2–2.2)
Albumin: 4.2 g/dL (ref 3.8–4.8)
Alkaline Phosphatase: 94 IU/L (ref 39–117)
BUN/Creatinine Ratio: 14 (ref 9–23)
BUN: 8 mg/dL (ref 6–24)
Bilirubin Total: 0.3 mg/dL (ref 0.0–1.2)
CO2: 24 mmol/L (ref 20–29)
Calcium: 9.2 mg/dL (ref 8.7–10.2)
Chloride: 102 mmol/L (ref 96–106)
Creatinine, Ser: 0.56 mg/dL — ABNORMAL LOW (ref 0.57–1.00)
GFR calc Af Amer: 129 mL/min/{1.73_m2} (ref >59)
GFR calc non Af Amer: 112 mL/min/{1.73_m2} (ref >59)
Globulin, Total: 3.1 g/dL (ref 1.5–4.5)
Glucose: 83 mg/dL (ref 65–99)
Potassium: 3.9 mmol/L (ref 3.5–5.2)
Sodium: 142 mmol/L (ref 134–144)
Total Protein: 7.3 g/dL (ref 6.0–8.5)

## 2019-06-23 LAB — HEMOGLOBIN A1C
Est. average glucose Bld gHb Est-mCnc: 123 mg/dL
Hgb A1c MFr Bld: 5.9 % — ABNORMAL HIGH (ref 4.8–5.6)

## 2019-06-23 LAB — VITAMIN B12: Vitamin B-12: 402 pg/mL (ref 232–1245)

## 2019-06-23 LAB — VITAMIN D 25 HYDROXY (VIT D DEFICIENCY, FRACTURES): Vit D, 25-Hydroxy: 49.4 ng/mL (ref 30.0–100.0)

## 2019-06-23 LAB — INSULIN, RANDOM: INSULIN: 7.3 u[IU]/mL (ref 2.6–24.9)

## 2019-06-24 ENCOUNTER — Encounter (INDEPENDENT_AMBULATORY_CARE_PROVIDER_SITE_OTHER): Payer: Self-pay | Admitting: Family Medicine

## 2019-07-13 ENCOUNTER — Encounter (INDEPENDENT_AMBULATORY_CARE_PROVIDER_SITE_OTHER): Payer: Self-pay | Admitting: Family Medicine

## 2019-07-13 ENCOUNTER — Telehealth (INDEPENDENT_AMBULATORY_CARE_PROVIDER_SITE_OTHER): Payer: 59 | Admitting: Family Medicine

## 2019-07-13 ENCOUNTER — Other Ambulatory Visit: Payer: Self-pay

## 2019-07-13 DIAGNOSIS — R7303 Prediabetes: Secondary | ICD-10-CM | POA: Insufficient documentation

## 2019-07-13 DIAGNOSIS — E559 Vitamin D deficiency, unspecified: Secondary | ICD-10-CM | POA: Insufficient documentation

## 2019-07-13 DIAGNOSIS — E538 Deficiency of other specified B group vitamins: Secondary | ICD-10-CM | POA: Insufficient documentation

## 2019-07-13 DIAGNOSIS — Z6830 Body mass index (BMI) 30.0-30.9, adult: Secondary | ICD-10-CM

## 2019-07-13 DIAGNOSIS — E7800 Pure hypercholesterolemia, unspecified: Secondary | ICD-10-CM | POA: Diagnosis not present

## 2019-07-13 DIAGNOSIS — F4321 Adjustment disorder with depressed mood: Secondary | ICD-10-CM

## 2019-07-13 DIAGNOSIS — E669 Obesity, unspecified: Secondary | ICD-10-CM

## 2019-07-13 MED ORDER — CYANOCOBALAMIN 1000 MCG/ML IJ SOLN: 1000.0000 ug | INTRAMUSCULAR | 0 refills | Status: DC

## 2019-07-13 MED ORDER — VITAMIN D (ERGOCALCIFEROL) 1.25 MG (50000 UNIT) PO CAPS: 50000.0000 [IU] | ORAL_CAPSULE | ORAL | 0 refills | Status: DC

## 2019-07-14 NOTE — Progress Notes (Signed)
TeleHealth Visit:  Due to the COVID-19 pandemic, this visit was completed with telemedicine (audio/video) technology to reduce patient and provider exposure as well as to preserve personal protective equipment.   Nancy Sanders has verbally consented to this TeleHealth visit. The patient is located at home, the provider is located at the [facility] office. The participants in this visit include the listed provider and patient and her spouse. The visit was conducted today via [email or web address].  Chief Complaint: OBESITY Nancy Sanders is here to discuss her progress with her obesity treatment plan along with follow-up of her obesity related diagnoses. Nancy Sanders is on the Category 2 Plan and states she is following her eating plan approximately 50% of the time. Nancy Sanders states she is exercising for 0 minutes 0 times per week.  Today's visit was #: 7 Starting weight: 194 lbs Starting date: 03/03/2019  Interim History: Nancy Sanders says that "it's been a hard few weeks".  She lost an in-law and family pet.  She says she does not like to exercise in bad weather.  Subjective:   1. Prediabetes Nancy Sanders has a diagnosis of prediabetes based on her elevated HgA1c and was informed this puts her at greater risk of developing diabetes. She continues to work on diet and exercise to decrease her risk of diabetes. She denies nausea or hypoglycemia.  She does not want to take medications.  Lab Results  Component Value Date   HGBA1C 5.9 (H) 06/22/2019   Lab Results  Component Value Date   INSULIN 7.3 06/22/2019   INSULIN 18.2 03/03/2019   2. Vitamin D deficiency Nancy Sanders's Vitamin D level was 49.4 on 06/22/2019. She is currently taking vit D. She denies nausea, vomiting or muscle weakness.  3. B12 nutritional deficiency She does not have a previous diagnosis of pernicious anemia.  She does not have a history of weight loss surgery.   Lab Results  Component Value Date   VITAMINB12 402 06/22/2019   4.  Pure hypercholesterolemia Nancy Sanders has hyperlipidemia and has been trying to improve her cholesterol levels with intensive lifestyle modification including a low saturated fat diet, exercise and weight loss. She denies any chest pain, claudication or myalgias.  She is taking Lipitor and Zetia.  Lab Results  Component Value Date   ALT 14 06/22/2019   AST 15 06/22/2019   ALKPHOS 94 06/22/2019   BILITOT 0.3 06/22/2019   Lab Results  Component Value Date   CHOL 187 06/22/2019   HDL 51 06/22/2019   LDLCALC 112 (H) 06/22/2019   TRIG 137 06/22/2019   5. Grief Nancy Sanders lost an in-law and a family pet over the past few weeks.  Assessment/Plan:   1. Prediabetes Nancy Sanders will continue to work on weight loss, exercise, and decreasing simple carbohydrates to help decrease the risk of diabetes.   2. Vitamin D deficiency Low Vitamin D level contributes to fatigue and are associated with obesity, breast, and colon cancer. She agrees to continue to take prescription Vitamin D @50 ,000 IU every week and will follow-up for routine testing of Vitamin D, at least 2-3 times per year to avoid over-replacement.  - Vitamin D, Ergocalciferol, (DRISDOL) 1.25 MG (50000 UNIT) CAPS capsule; Take 1 capsule (50,000 Units total) by mouth every 7 (seven) days.  Dispense: 4 capsule; Refill: 0  3. B12 nutritional deficiency The diagnosis was reviewed with the patient. Counseling provided today, see below. We will continue to monitor. Orders and follow up as documented in patient record.  Counseling . The body needs  vitamin B12: to make red blood cells; to make DNA; and to help the nerves work properly so they can carry messages from the brain to the body.  . The main causes of vitamin B12 deficiency include dietary deficiency, digestive diseases, pernicious anemia, and having a surgery in which part of the stomach or small intestine is removed.  . Certain medicines can make it harder for the body to absorb vitamin  B12. These medicines include: heartburn medications; some antibiotics; some medications used to treat diabetes, gout, and high cholesterol.  . In some cases, there are no symptoms of this condition. If the condition leads to anemia or nerve damage, various symptoms can occur, such as weakness or fatigue, shortness of breath, and numbness or tingling in your hands and feet.   . Treatment:  o May include taking vitamin B12 supplements.  o Avoid alcohol.  o Eat lots of healthy foods that contain vitamin B12: - Beef, pork, chicken, [place], and organ meats, such as liver.  - Seafood: This includes clams, rainbow trout, salmon, tuna, and haddock. Eggs.  - Cereal and dairy products that are fortified: This means that vitamin B12 has been added to the food.   - cyanocobalamin (,VITAMIN B-12,) 1000 MCG/ML injection; Inject 1 mL (1,000 mcg total) into the muscle every 14 (fourteen) days.  Dispense: 1 mL; Refill: 0  4. Pure hypercholesterolemia Cardiovascular risk and specific lipid/LDL goals reviewed.  We discussed several lifestyle modifications today and Nancy Sanders will continue to work on diet, exercise and weight loss efforts. Orders and follow up as documented in patient record.   Counseling Intensive lifestyle modifications are the first line treatment for this issue. . Dietary changes: Increase soluble fiber. Decrease simple carbohydrates. . Exercise changes: Moderate to vigorous-intensity aerobic activity 150 minutes per week if tolerated. . Lipid-lowering medications: see documented in medical record.  5. Grief Discussed self-care.  6. Class 1 obesity with serious comorbidity and body mass index (BMI) of 30.0 to 30.9 in adult, unspecified obesity type Nancy Sanders is currently in the action stage of change. As such, her goal is to continue with weight loss efforts. She has agreed to the Category 2 Plan.   Exercise goals: For substantial health benefits, adults should do at least 150 minutes (2  hours and 30 minutes) a week of moderate-intensity, or 75 minutes (1 hour and 15 minutes) a week of vigorous-intensity aerobic physical activity, or an equivalent combination of moderate- and vigorous-intensity aerobic activity. Aerobic activity should be performed in episodes of at least 10 minutes, and preferably, it should be spread throughout the week.  Behavioral modification strategies: increasing lean protein intake.  Azzaria has agreed to follow-up with our clinic in 3 weeks. She was informed of the importance of frequent follow-up visits to maximize her success with intensive lifestyle modifications for her multiple health conditions.  Objective:   VITALS: Per patient if applicable, see vitals. GENERAL: Alert and in no acute distress. CARDIOPULMONARY: No increased WOB. Speaking in clear sentences.  PSYCH: Pleasant and cooperative. Speech normal rate and rhythm. Affect is appropriate. Insight and judgement are appropriate. Attention is focused, linear, and appropriate.  NEURO: Oriented as arrived to appointment on time with no prompting.   Lab Results  Component Value Date   CREATININE 0.56 (L) 06/22/2019   BUN 8 06/22/2019   NA 142 06/22/2019   K 3.9 06/22/2019   CL 102 06/22/2019   CO2 24 06/22/2019   Lab Results  Component Value Date   ALT 14  06/22/2019   AST 15 06/22/2019   ALKPHOS 94 06/22/2019   BILITOT 0.3 06/22/2019   Lab Results  Component Value Date   HGBA1C 5.9 (H) 06/22/2019   Lab Results  Component Value Date   INSULIN 7.3 06/22/2019   INSULIN 18.2 03/03/2019   Lab Results  Component Value Date   TSH 1.220 03/03/2019   Lab Results  Component Value Date   CHOL 187 06/22/2019   HDL 51 06/22/2019   LDLCALC 112 (H) 06/22/2019   TRIG 137 06/22/2019   Lab Results  Component Value Date   WBC 6.9 06/22/2019   HGB 12.2 06/22/2019   HCT 38.1 06/22/2019   MCV 92 06/22/2019   PLT 284 06/22/2019   Attestation Statements:   Reviewed by clinician on  day of visit: allergies, medications, problem list, medical history, surgical history, family history, social history, and previous encounter notes.  I, Water quality scientist, CMA, am acting as Location manager for [employer], DO.  I have reviewed the above documentation for accuracy and completeness, and I agree with the above. Briscoe Deutscher, DO

## 2019-08-03 ENCOUNTER — Ambulatory Visit (INDEPENDENT_AMBULATORY_CARE_PROVIDER_SITE_OTHER): Payer: 59 | Admitting: Family Medicine

## 2019-08-03 ENCOUNTER — Encounter (INDEPENDENT_AMBULATORY_CARE_PROVIDER_SITE_OTHER): Payer: Self-pay | Admitting: Family Medicine

## 2019-08-03 ENCOUNTER — Other Ambulatory Visit: Payer: Self-pay

## 2019-08-03 VITALS — BP 114/72 | HR 89 | Temp 98.0°F | Ht 63.0 in | Wt 169.0 lb

## 2019-08-03 DIAGNOSIS — E7849 Other hyperlipidemia: Secondary | ICD-10-CM

## 2019-08-03 DIAGNOSIS — E669 Obesity, unspecified: Secondary | ICD-10-CM

## 2019-08-03 DIAGNOSIS — R7303 Prediabetes: Secondary | ICD-10-CM

## 2019-08-03 DIAGNOSIS — Z9189 Other specified personal risk factors, not elsewhere classified: Secondary | ICD-10-CM

## 2019-08-03 DIAGNOSIS — Z6830 Body mass index (BMI) 30.0-30.9, adult: Secondary | ICD-10-CM

## 2019-08-03 DIAGNOSIS — I10 Essential (primary) hypertension: Secondary | ICD-10-CM

## 2019-08-03 DIAGNOSIS — E538 Deficiency of other specified B group vitamins: Secondary | ICD-10-CM

## 2019-08-03 DIAGNOSIS — E559 Vitamin D deficiency, unspecified: Secondary | ICD-10-CM | POA: Diagnosis not present

## 2019-08-03 MED ORDER — CYANOCOBALAMIN 1000 MCG/ML IJ SOLN: 1000.0000 ug | INTRAMUSCULAR | 45 refills | Status: DC

## 2019-08-03 MED ORDER — VITAMIN D (ERGOCALCIFEROL) 1.25 MG (50000 UNIT) PO CAPS: 50000.0000 [IU] | ORAL_CAPSULE | ORAL | 0 refills | Status: DC

## 2019-08-03 NOTE — Progress Notes (Signed)
Chief Complaint:   OBESITY Nancy Sanders is here to discuss her progress with her obesity treatment plan along with follow-up of her obesity related diagnoses. Nancy Sanders is on the Category 2 Plan and states she is following her eating plan approximately 60-70% of the time. 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  Today's visit was #: 8 Starting weight: 194 lbs Starting date: 03/03/2019 Today's weight: 169 lbs Today's date: 08/03/2019 Total lbs lost to date: 25 lbs Total lbs lost since last in-office visit: 0  Interim History: Nancy Sanders is up one pound of water.   She says she is extremely hungry when she gets home from work (5-6 pm).  She eats dinner between 6-6:30 pm.  Lunch is a microwave meal [employer]) - she likes the small amount of sides like mac & cheese.  Subjective:   1. B12 nutritional deficiency She is not a vegetarian.  She does not have a previous diagnosis of pernicious anemia.  She does not have a history of weight loss surgery. She is taking vitamin B12 injections every 2 weeks.  Lab Results  Component Value Date   VITAMINB12 402 06/22/2019   2. Prediabetes Nancy Sanders has a diagnosis of prediabetes based on her elevated HgA1c and was informed this puts her at greater risk of developing diabetes. She continues to work on diet and exercise to decrease her risk of diabetes. She denies nausea or hypoglycemia.  Lab Results  Component Value Date   HGBA1C 5.9 (H) 06/22/2019   Lab Results  Component Value Date   INSULIN 7.3 06/22/2019   INSULIN 18.2 03/03/2019   3. Vitamin D deficiency Nancy Sanders's Vitamin D level was 49.4 on 06/22/2019. She is currently taking vit D. She denies nausea, vomiting or muscle weakness.  4. Other hyperlipidemia Nancy Sanders has hyperlipidemia and has been trying to improve her cholesterol levels with intensive lifestyle modification including a low saturated fat diet, exercise and weight loss. She denies any chest  pain, claudication or myalgias.  She is taking Lipitor and Zetia.  Lab Results  Component Value Date   ALT 14 06/22/2019   AST 15 06/22/2019   ALKPHOS 94 06/22/2019   BILITOT 0.3 06/22/2019   Lab Results  Component Value Date   CHOL 187 06/22/2019   HDL 51 06/22/2019   LDLCALC 112 (H) 06/22/2019   TRIG 137 06/22/2019   5. Essential hypertension Review: taking medications as instructed, no medication side effects noted, no chest pain on exertion, no dyspnea on exertion, no swelling of ankles.  She takes lisinopril for blood pressure control.  BP Readings from Last 3 Encounters:  08/03/19 114/72  06/22/19 95/64  06/02/19 104/71   6. At risk for diabetes mellitus Nancy Sanders was given approximately 15 minutes of diabetes education and counseling today. We discussed intensive lifestyle modifications today with an emphasis on weight loss as well as increasing exercise and decreasing simple carbohydrates in her diet. We also reviewed medication options with an emphasis on risk versus benefit of those discussed.   Repetitive spaced learning was employed today to elicit superior memory formation and behavioral change.  Assessment/Plan:   1. B12 nutritional deficiency The diagnosis was reviewed with the patient. Counseling provided today, see below. We will continue to monitor. Orders and follow up as documented in patient record.  Counseling . The body needs vitamin B12: to make red blood cells; to make DNA; and to help the nerves work properly so they can  carry messages from the brain to the body.  . The main causes of vitamin B12 deficiency include dietary deficiency, digestive diseases, pernicious anemia, and having a surgery in which part of the stomach or small intestine is removed.  . Certain medicines can make it harder for the body to absorb vitamin B12. These medicines include: heartburn medications; some antibiotics; some medications used to treat diabetes, gout, and high  cholesterol.  . In some cases, there are no symptoms of this condition. If the condition leads to anemia or nerve damage, various symptoms can occur, such as weakness or fatigue, shortness of breath, and numbness or tingling in your hands and feet.   . Treatment:  o May include taking vitamin B12 supplements.  o Avoid alcohol.  o Eat lots of healthy foods that contain vitamin B12: - Beef, pork, chicken, [place], and organ meats, such as liver.  - Seafood: This includes clams, rainbow trout, salmon, tuna, and haddock. Eggs.  - Cereal and dairy products that are fortified: This means that vitamin B12 has been added to the food.   Orders - cyanocobalamin (,VITAMIN B-12,) 1000 MCG/ML injection; Inject 1 mL (1,000 mcg total) into the muscle every 14 (fourteen) days.  Dispense: 1 mL; Refill: 45  2. Prediabetes Nancy Sanders will continue to work on weight loss, exercise, and decreasing simple carbohydrates to help decrease the risk of diabetes.   3. Vitamin D deficiency Low Vitamin D level contributes to fatigue and are associated with obesity, breast, and colon cancer. She agrees to continue to take prescription Vitamin D _0 ,000 IU every week and will follow-up for routine testing of Vitamin D, at least 2-3 times per year to avoid over-replacement.  Orders - Vitamin D, Ergocalciferol, (DRISDOL) 1.25 MG (50000 UNIT) CAPS capsule; Take 1 capsule (50,000 Units total) by mouth every 7 (seven) days.  Dispense: 4 capsule; Refill: 0  4. Other hyperlipidemia Cardiovascular risk and specific lipid/LDL goals reviewed.  We discussed several lifestyle modifications today and Nancy Sanders will continue to work on diet, exercise and weight loss efforts. Orders and follow up as documented in patient record.   Counseling Intensive lifestyle modifications are the first line treatment for this issue. . Dietary changes: Increase soluble fiber. Decrease simple carbohydrates. . Exercise changes: Moderate to  vigorous-intensity aerobic activity 150 minutes per week if tolerated. . Lipid-lowering medications: see documented in medical record.  5. Essential hypertension Nancy Sanders is working on healthy weight loss and exercise to improve blood pressure control. We will watch for signs of hypotension as she continues her lifestyle modifications.  6. At risk for diabetes mellitus Nancy Sanders was given approximately 15 minutes of diabetes education and counseling today. We discussed intensive lifestyle modifications today with an emphasis on weight loss as well as increasing exercise and decreasing simple carbohydrates in her diet. We also reviewed medication options with an emphasis on risk versus benefit of those discussed.   Repetitive spaced learning was employed today to elicit superior memory formation and behavioral change.  7. Class 1 obesity with serious comorbidity and body mass index (BMI) of 30.0 to 30.9 in adult, unspecified obesity type Nancy Sanders is currently in the action stage of change. As such, her goal is to continue with weight loss efforts. She has agreed to the Category 3 Plan.   Exercise goals: As is.  Behavioral modification strategies: increasing lean protein intake and increasing water intake.  Lunch to be 4 ounces lean meat and vegetables.  Okay small sweet potato/1 serving of rice or other  portioned starch if no bread.  Recipes given.  Nancy Sanders has agreed to follow-up with our clinic in 2-3 weeks. She was informed of the importance of frequent follow-up visits to maximize her success with intensive lifestyle modifications for her multiple health conditions.   Objective:   Blood pressure 114/72, pulse 89, temperature 98 F (36.7 C), temperature source Oral, height _0  (1.6 m), weight 169 lb (76.7 kg), SpO2 100 %. Body mass index is 29.94 kg/m.  General: Cooperative, alert, well developed, in no acute distress. HEENT: Conjunctivae and lids unremarkable. Cardiovascular:  Regular rhythm.  Lungs: Normal work of breathing. Neurologic: No focal deficits.   Lab Results  Component Value Date   CREATININE 0.56 (L) 06/22/2019   BUN 8 06/22/2019   NA 142 06/22/2019   K 3.9 06/22/2019   CL 102 06/22/2019   CO2 24 06/22/2019   Lab Results  Component Value Date   ALT 14 06/22/2019   AST 15 06/22/2019   ALKPHOS 94 06/22/2019   BILITOT 0.3 06/22/2019   Lab Results  Component Value Date   HGBA1C 5.9 (H) 06/22/2019   Lab Results  Component Value Date   INSULIN 7.3 06/22/2019   INSULIN 18.2 03/03/2019   Lab Results  Component Value Date   TSH 1.220 03/03/2019   Lab Results  Component Value Date   CHOL 187 06/22/2019   HDL 51 06/22/2019   LDLCALC 112 (H) 06/22/2019   TRIG 137 06/22/2019   Lab Results  Component Value Date   WBC 6.9 06/22/2019   HGB 12.2 06/22/2019   HCT 38.1 06/22/2019   MCV 92 06/22/2019   PLT 284 06/22/2019   Attestation Statements:   Reviewed by clinician on day of visit: allergies, medications, problem list, medical history, surgical history, family history, social history, and previous encounter notes.  I, Water quality scientist, CMA, am acting as Location manager for [employer], DO.  I have reviewed the above documentation for accuracy and completeness, and I agree with the above. Briscoe Deutscher, DO

## 2019-08-18 ENCOUNTER — Ambulatory Visit (INDEPENDENT_AMBULATORY_CARE_PROVIDER_SITE_OTHER): Payer: 59 | Admitting: Family Medicine

## 2019-08-27 ENCOUNTER — Ambulatory Visit (INDEPENDENT_AMBULATORY_CARE_PROVIDER_SITE_OTHER): Payer: 59 | Admitting: Family Medicine

## 2019-08-27 ENCOUNTER — Other Ambulatory Visit: Payer: Self-pay

## 2019-08-27 ENCOUNTER — Encounter (INDEPENDENT_AMBULATORY_CARE_PROVIDER_SITE_OTHER): Payer: Self-pay | Admitting: Family Medicine

## 2019-08-27 VITALS — BP 133/76 | HR 75 | Temp 97.7°F | Ht 63.0 in | Wt 168.0 lb

## 2019-08-27 DIAGNOSIS — E669 Obesity, unspecified: Secondary | ICD-10-CM

## 2019-08-27 DIAGNOSIS — E559 Vitamin D deficiency, unspecified: Secondary | ICD-10-CM | POA: Diagnosis not present

## 2019-08-27 DIAGNOSIS — E538 Deficiency of other specified B group vitamins: Secondary | ICD-10-CM

## 2019-08-27 DIAGNOSIS — Z9189 Other specified personal risk factors, not elsewhere classified: Secondary | ICD-10-CM

## 2019-08-27 DIAGNOSIS — Z6830 Body mass index (BMI) 30.0-30.9, adult: Secondary | ICD-10-CM

## 2019-08-27 MED ORDER — VITAMIN D (ERGOCALCIFEROL) 1.25 MG (50000 UNIT) PO CAPS: 50000.0000 [IU] | ORAL_CAPSULE | ORAL | 0 refills | Status: DC

## 2019-08-27 MED ORDER — CYANOCOBALAMIN 1000 MCG/ML IJ SOLN: 1000.0000 ug | INTRAMUSCULAR | 45 refills | Status: DC

## 2019-08-27 NOTE — Progress Notes (Signed)
Chief Complaint:   OBESITY Nancy Sanders is here to discuss her progress with her obesity treatment plan along with follow-up of her obesity related diagnoses. Nancy Sanders is on the Category 3 Plan and states she is following her eating plan approximately 75% of the time. Nancy Sanders states she is exercising 0 minutes 0 times per week.  Today's visit was #: 10 Starting weight: 194 lbs Starting date: 03/03/2019 Today's weight: 168 lbs Today's date: 08/27/2019 Total lbs lost to date: 26 Total lbs lost since last in-office visit: 1  Interim History: For breakfast, Nancy Sanders has 2 eggs with 2 pieces of toast with milk and is mostly satisfied; lunch is a sandwich most days and then fruit and yogurt later in the afternoon (occasionally adding protein to microwave meals). She is eating mostly chicken at night. She has had a few indulgent foods, but nothing consistently.  Subjective:   Vitamin D deficiency. No nausea, vomiting, or muscle weakness. She endorses fatigue. Last Vitamin D 49.4 on 06/22/2019.  B12 nutritional deficiency. Nancy Sanders endorses fatigue and voices this was most recently increased.   Lab Results  Component Value Date   [id number] 06/22/2019   At risk for osteoporosis. Nancy Sanders is at higher risk of osteopenia and osteoporosis due to Vitamin D deficiency.   Assessment/Plan:   Vitamin D deficiency. Low Vitamin D level contributes to fatigue and are associated with obesity, breast, and colon cancer. She was given a refill on her Vitamin D, Ergocalciferol, (DRISDOL) 1.25 MG (50000 UNIT) CAPS capsule every week #4 with 0 refills and will follow-up for routine testing of Vitamin D, at least 2-3 times per year to avoid over-replacement.    B12 nutritional deficiency. The diagnosis was reviewed with the patient. Counseling provided today, see below. We will continue to monitor. Orders and follow up as documented in patient record. Nancy Sanders was given a refill on her  cyanocobalamin (,VITAMIN B-12,) 1000 MCG/ML injection IM every 14 days #1 mL with 0 refills.  Counseling . The body needs vitamin B12: to make red blood cells; to make DNA; and to help the nerves work properly so they can carry messages from the brain to the body.  . The main causes of vitamin B12 deficiency include dietary deficiency, digestive diseases, pernicious anemia, and having a surgery in which part of the stomach or small intestine is removed.  . Certain medicines can make it harder for the body to absorb vitamin B12. These medicines include: heartburn medications; some antibiotics; some medications used to treat diabetes, gout, and high cholesterol.  . In some cases, there are no symptoms of this condition. If the condition leads to anemia or nerve damage, various symptoms can occur, such as weakness or fatigue, shortness of breath, and numbness or tingling in your hands and feet.   . Treatment:  o May include taking vitamin B12 supplements.  o Avoid alcohol.  o Eat lots of healthy foods that contain vitamin B12: - Beef, pork, chicken, [place], and organ meats, such as liver.  - Seafood: This includes clams, rainbow trout, salmon, tuna, and haddock.  - Eggs.  - Cereal and dairy products that are fortified: This means that vitamin B12 has been added to the food.      At risk for osteoporosis. Nancy Sanders was given approximately 15 minutes of osteoporosis prevention counseling today. Nancy Sanders is at risk for osteopenia and osteoporosis due to her Vitamin D deficiency. She was encouraged to take her Vitamin D and follow her higher  calcium diet and increase strengthening exercise to help strengthen her bones and decrease her risk of osteopenia and osteoporosis.  Repetitive spaced learning was employed today to elicit superior memory formation and behavioral change.  Class 1 obesity with serious comorbidity and body mass index (BMI) of 30.0 to 30.9 in adult, unspecified obesity type - Starting  BMI greater then 30.  Nancy Sanders is currently in the action stage of change. As such, her goal is to continue with weight loss efforts. She has agreed to the Category 3 Plan.   Exercise goals: Nancy Sanders will start physical activity 10 minutes 3 times per week.  Behavioral modification strategies: increasing lean protein intake, increasing vegetables, meal planning and cooking strategies and keeping healthy foods in the home.  Nancy Sanders has agreed to follow-up with our clinic in 2 weeks. She was informed of the importance of frequent follow-up visits to maximize her success with intensive lifestyle modifications for her multiple health conditions.   Objective:   Blood pressure 133/76, pulse 75, temperature 97.7 F (36.5 C), temperature source Oral, height 5\' 3"  (1.6 m), weight 168 lb (76.2 kg), SpO2 99 %. Body mass index is 29.76 kg/m.  General: Cooperative, alert, well developed, in no acute distress. HEENT: Conjunctivae and lids unremarkable. Cardiovascular: Regular rhythm.  Lungs: Normal work of breathing. Neurologic: No focal deficits.   Lab Results  Component Value Date   CREATININE 0.56 (L) 06/22/2019   BUN 8 06/22/2019   NA 142 06/22/2019   K 3.9 06/22/2019   CL 102 06/22/2019   CO2 24 06/22/2019   Lab Results  Component Value Date   ALT 14 06/22/2019   AST 15 06/22/2019   ALKPHOS 94 06/22/2019   BILITOT 0.3 06/22/2019   Lab Results  Component Value Date   HGBA1C 5.9 (H) 06/22/2019   Lab Results  Component Value Date   INSULIN 7.3 06/22/2019   INSULIN 18.2 03/03/2019   Lab Results  Component Value Date   TSH 1.220 03/03/2019   Lab Results  Component Value Date   CHOL 187 06/22/2019   HDL 51 06/22/2019   LDLCALC 112 (H) 06/22/2019   TRIG 137 06/22/2019   Lab Results  Component Value Date   WBC 6.9 06/22/2019   HGB 12.2 06/22/2019   HCT 38.1 06/22/2019   MCV 92 06/22/2019   PLT 284 06/22/2019   No results found for: IRON, TIBC,  FERRITIN  Attestation Statements:   Reviewed by clinician on day of visit: allergies, medications, problem list, medical history, surgical history, family history, social history, and previous encounter notes.  I, Michaelene Song, am acting as transcriptionist for Coralie Common, MD   I have reviewed the above documentation for accuracy and completeness, and I agree with the above. - Ilene Qua, MD

## 2019-09-08 IMAGING — MG STEREOTACTIC CORE NEEDLE BIOPSY
8 of 12 series · 8 of 24 positions shown · non-contrast
Comparison: Previous exams.

Addendum:
CLINICAL DATA: Questionable subtle architectural distortion in the
upper inner quadrant of the left breast at recent mammography with
no sonographic correlate.

EXAM:
LEFT BREAST STEREOTACTIC CORE NEEDLE BIOPSY

[L (1 of 8)]
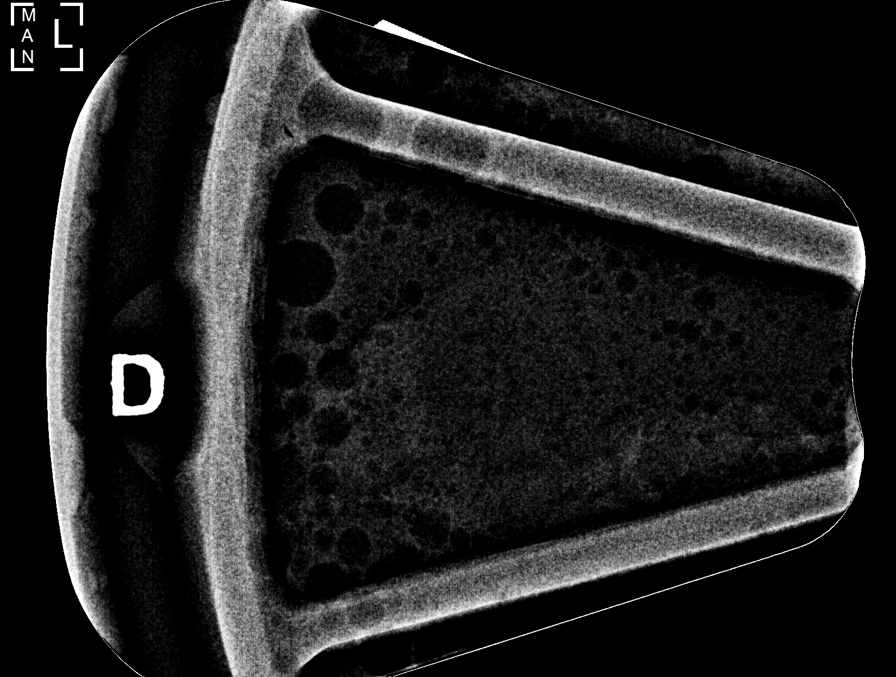

[L (2 of 8)]
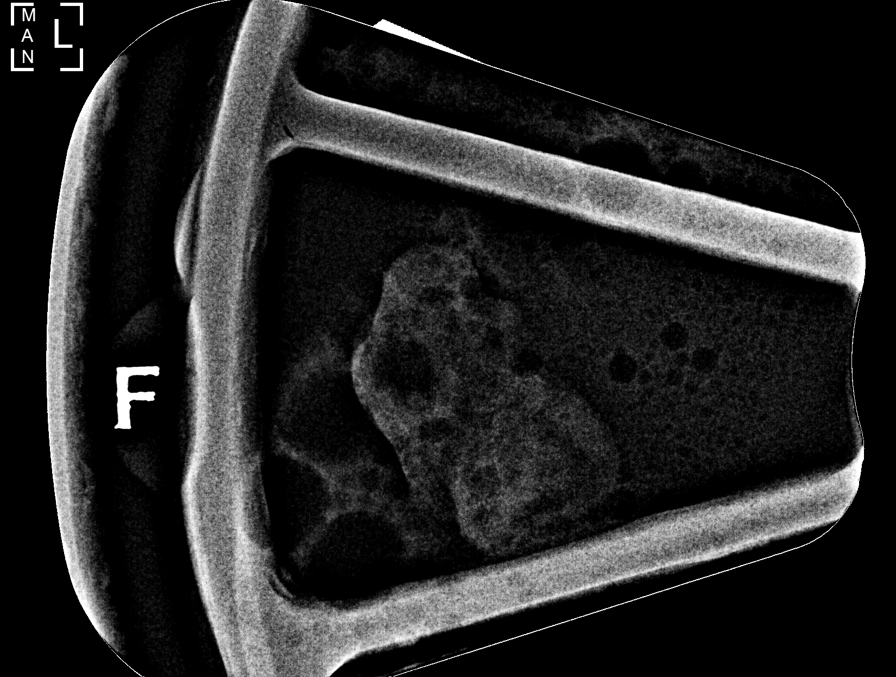

[L (3 of 8)]
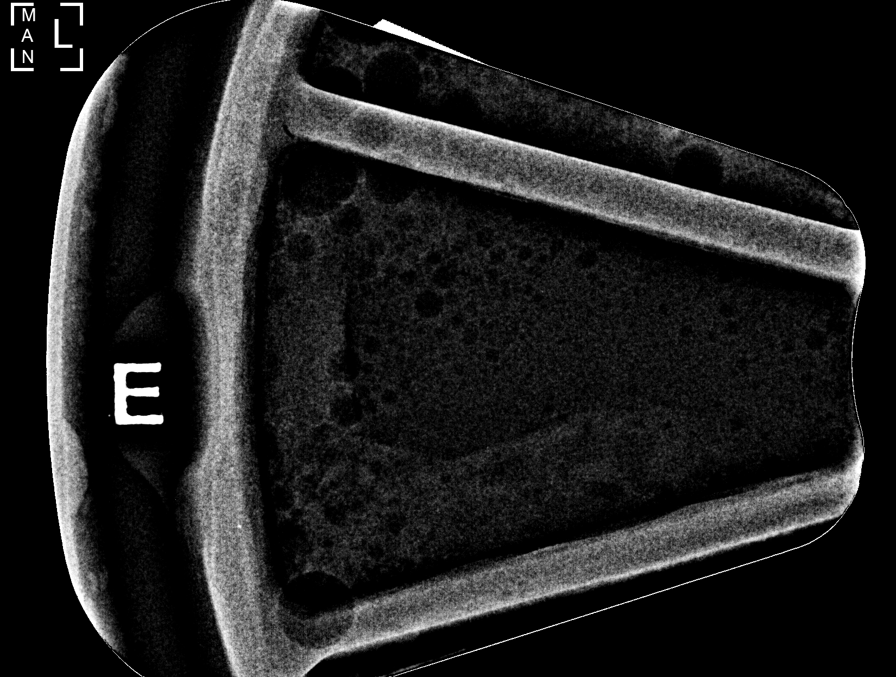

[L (4 of 8)]
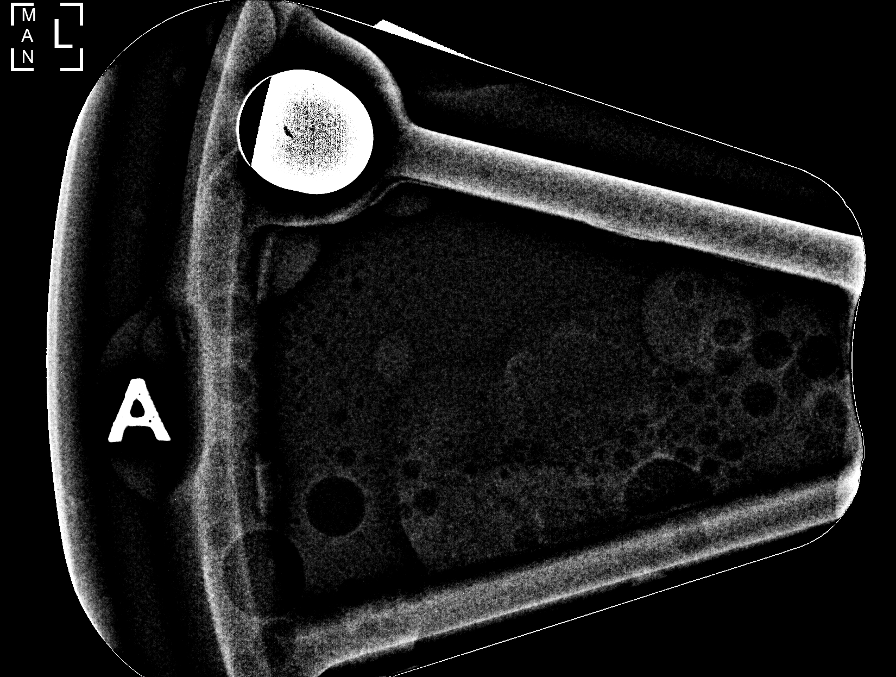

[L (5 of 8)]
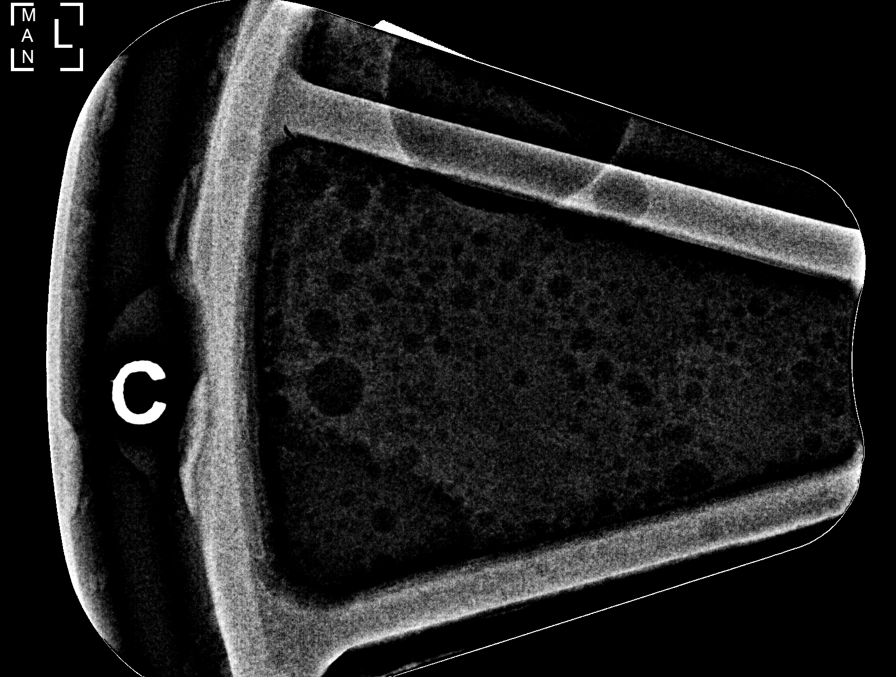

[L (6 of 8)]
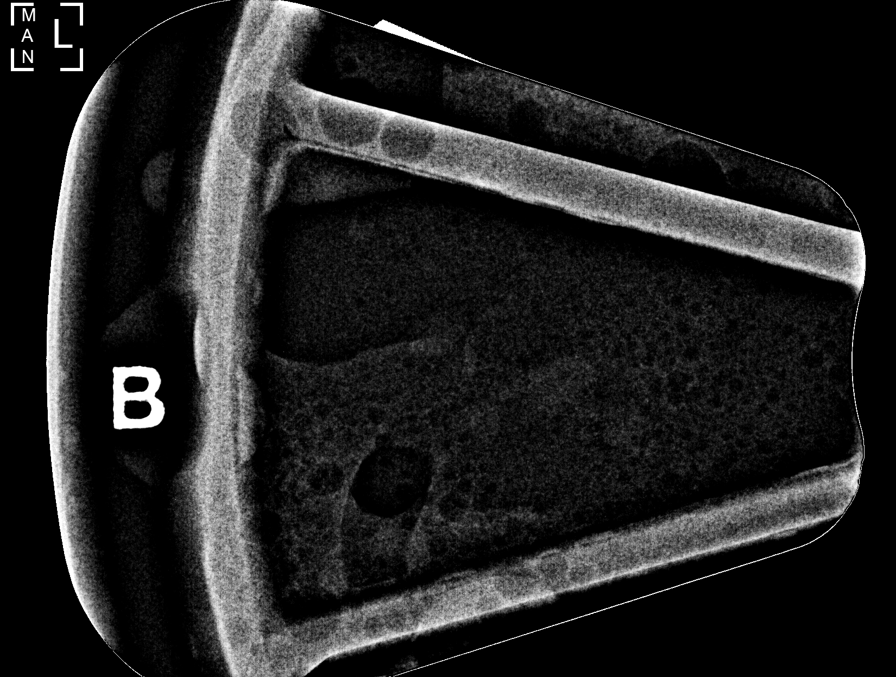

[L (7 of 8)]
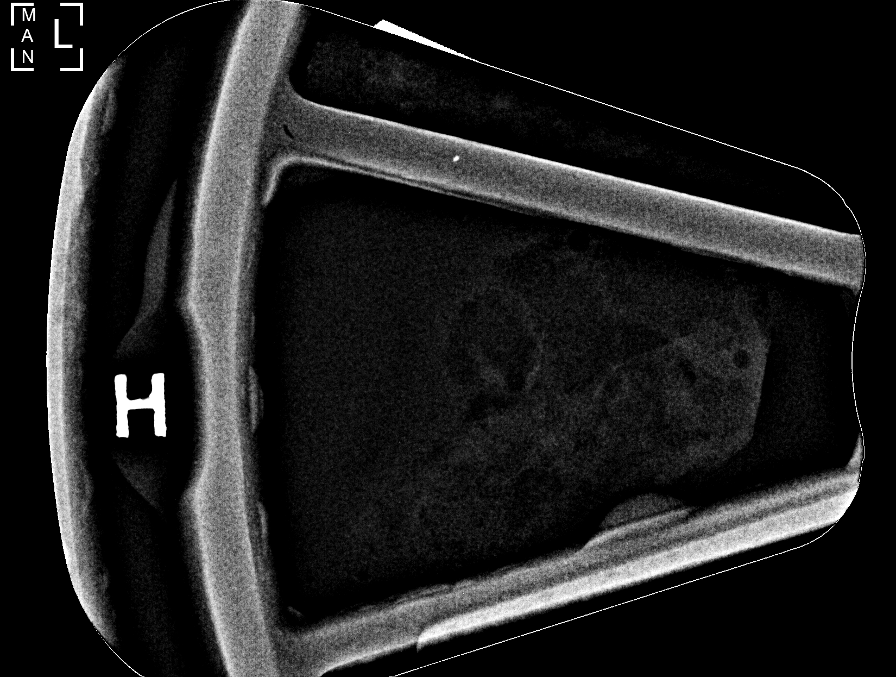

[L (8 of 8)]
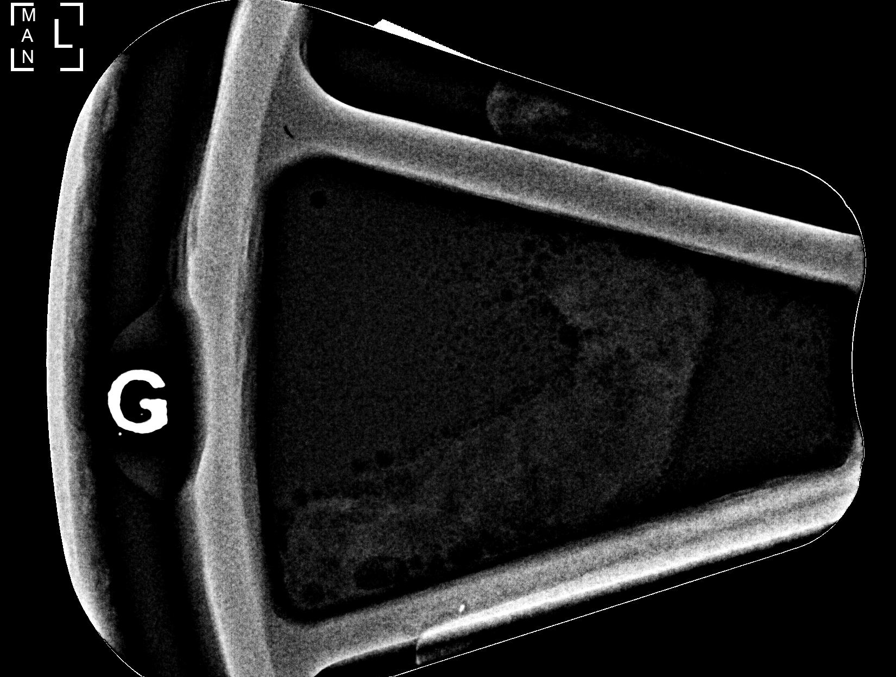

[8 of 24 positions shown; findings below may reference images not displayed]



Using sterile technique and 1% Lidocaine as local anesthetic, under
stereotactic guidance, a 9 gauge vacuum assisted device was used to
perform core needle biopsy of the recently demonstrated questionable
subtle architectural distortion in the upper inner quadrant of the
left breast using a cephalad approach.

Lesion quadrant: Upper inner quadrant

At the conclusion of the procedure, a coil shaped tissue marker clip
was deployed into the biopsy cavity. Follow-up 2-view mammogram was
performed and dictated separately.
IMPRESSION: Stereotactic-guided biopsy of the recently demonstrated questionable
subtle architectural distortion in the upper inner quadrant of the
left breast. No apparent complications.

ADDENDUM:
Pathology revealed BENIGN BREAST PARENCHYMA WITH FIBROCYSTIC CHANGE,
INCLUDING ADENOSIS of the Left breast, upper inner quadrant. This
was found to be concordant by Dr. Nunito Hasa.

Pathology results were discussed with the patient by telephone. The
patient reported doing well after the biopsy with tenderness,
bruising and minimal bleeding at the site. Post biopsy instructions
and care were reviewed and questions were answered. The patient was
encouraged to call The [REDACTED] for any
additional concerns.

The patient was instructed to return for left 3-D diagnostic
mammography in 6 months and informed a reminder notice would be sent
regarding this appointment.

Pathology results reported by Quirijn Amazigh, RN on 06/02/2018.

*** End of Addendum ***

## 2019-09-16 ENCOUNTER — Encounter (INDEPENDENT_AMBULATORY_CARE_PROVIDER_SITE_OTHER): Payer: Self-pay | Admitting: Family Medicine

## 2019-09-16 ENCOUNTER — Ambulatory Visit (INDEPENDENT_AMBULATORY_CARE_PROVIDER_SITE_OTHER): Payer: 59 | Admitting: Family Medicine

## 2019-09-16 ENCOUNTER — Other Ambulatory Visit: Payer: Self-pay

## 2019-09-16 VITALS — BP 105/69 | HR 94 | Temp 97.6°F | Ht 63.0 in | Wt 165.0 lb

## 2019-09-16 DIAGNOSIS — Z6830 Body mass index (BMI) 30.0-30.9, adult: Secondary | ICD-10-CM

## 2019-09-16 DIAGNOSIS — E538 Deficiency of other specified B group vitamins: Secondary | ICD-10-CM

## 2019-09-16 DIAGNOSIS — E559 Vitamin D deficiency, unspecified: Secondary | ICD-10-CM

## 2019-09-16 DIAGNOSIS — Z9189 Other specified personal risk factors, not elsewhere classified: Secondary | ICD-10-CM

## 2019-09-16 DIAGNOSIS — E669 Obesity, unspecified: Secondary | ICD-10-CM

## 2019-09-16 MED ORDER — VITAMIN D (ERGOCALCIFEROL) 1.25 MG (50000 UNIT) PO CAPS: 50000.0000 [IU] | ORAL_CAPSULE | ORAL | 0 refills | Status: DC

## 2019-09-16 MED ORDER — CYANOCOBALAMIN 1000 MCG/ML IJ SOLN: 1000.0000 ug | INTRAMUSCULAR | 45 refills | Status: DC

## 2019-09-17 NOTE — Progress Notes (Signed)
Chief Complaint:   OBESITY Nancy Sanders is here to discuss her progress with her obesity treatment plan along with follow-up of her obesity related diagnoses. Nancy Sanders is on the Category 3 Plan and states she is following her eating plan approximately 70% of the time. Nancy Sanders states she is walking 60 minutes 2 times per week.  Today's visit was #: 11 Starting weight: 194 lbs Starting date: 03/03/2019 Today's weight: 165 lbs Today's date: 09/16/2019 Total lbs lost to date: 29 Total lbs lost since last in-office visit: 3  Interim History: Over the last few weeks Nancy Sanders voices she has been remodeling her laundry room and also went to the beach this past weekend. While at the beach, she was not following her plan and did a bit of indulgent eating. She denies hunger and notes no obstacles in the next few weeks. She denies eating many snacks.  Subjective:   Vitamin D deficiency. No nausea, vomiting, or muscle weakness. She endorses fatigue. Last Vitamin D 49.4 on 06/22/2019.  B12 deficiency. Nancey endorses fatigue.   Lab Results  Component Value Date   [id number] 06/22/2019   At risk for activity intolerance. Nancy Sanders is at risk of exercise intolerance due to obesity.  Assessment/Plan:   Vitamin D deficiency. Low Vitamin D level contributes to fatigue and are associated with obesity, breast, and colon cancer. She was given a refill on her Vitamin D, Ergocalciferol, (DRISDOL) 1.25 MG (50000 UNIT) CAPS capsule  every week #4 with 0 refills and will follow-up for routine testing of Vitamin D, at least 2-3 times per year to avoid over-replacement.    B12 deficiency. The diagnosis was reviewed with the patient. Counseling provided today, see below. We will continue to monitor. Orders and follow up as documented in patient record. Refill was given for cyanocobalamin (,VITAMIN B-12,) 1000 MCG/ML injection 1 mL IM every 14 days, #1 mL with 45 refills.  Counseling . The  body needs vitamin B12: to make red blood cells; to make DNA; and to help the nerves work properly so they can carry messages from the brain to the body.  . The main causes of vitamin B12 deficiency include dietary deficiency, digestive diseases, pernicious anemia, and having a surgery in which part of the stomach or small intestine is removed.  . Certain medicines can make it harder for the body to absorb vitamin B12. These medicines include: heartburn medications; some antibiotics; some medications used to treat diabetes, gout, and high cholesterol.  . In some cases, there are no symptoms of this condition. If the condition leads to anemia or nerve damage, various symptoms can occur, such as weakness or fatigue, shortness of breath, and numbness or tingling in your hands and feet.   . Treatment:  o May include taking vitamin B12 supplements.  o Avoid alcohol.  o Eat lots of healthy foods that contain vitamin B12: - Beef, pork, chicken, [place], and organ meats, such as liver.  - Seafood: This includes clams, rainbow trout, salmon, tuna, and haddock.  - Eggs.  - Cereal and dairy products that are fortified: This means that vitamin B12 has been added to the food.     At risk for activity intolerance. Nancy Sanders was given approximately 15 minutes of exercise intolerance counseling today. She is 49 y.o. female and has risk factors exercise intolerance including obesity. We discussed intensive lifestyle modifications today with an emphasis on specific weight loss instructions and strategies. Nancy Sanders will slowly increase activity as tolerated.  Repetitive spaced learning was employed today to elicit superior memory formation and behavioral change.  Class 1 obesity with serious comorbidity and body mass index (BMI) of 30.0 to 30.9 in adult, unspecified obesity type - BMI greater than 30 at start of program.   Nancy Sanders is currently in the action stage of change. As such, her goal is to continue with  weight loss efforts. She has agreed to the Category 3 Plan.   Exercise goals: Nancy Sanders will start resistance band exercises 10 minutes 2-3 times per week.  Behavioral modification strategies: increasing lean protein intake, increasing vegetables, meal planning and cooking strategies, keeping healthy foods in the home and travel eating strategies.  Nancy Sanders has agreed to follow-up with our clinic in 2-3 weeks. She was informed of the importance of frequent follow-up visits to maximize her success with intensive lifestyle modifications for her multiple health conditions.   Objective:   Blood pressure 105/69, pulse 94, temperature 97.6 F (36.4 C), temperature source Oral, height 5\' 3"  (1.6 m), weight 165 lb (74.8 kg), SpO2 98 %. Body mass index is 29.23 kg/m.  General: Cooperative, alert, well developed, in no acute distress. HEENT: Conjunctivae and lids unremarkable. Cardiovascular: Regular rhythm.  Lungs: Normal work of breathing. Neurologic: No focal deficits.   Lab Results  Component Value Date   CREATININE 0.56 (L) 06/22/2019   BUN 8 06/22/2019   NA 142 06/22/2019   K 3.9 06/22/2019   CL 102 06/22/2019   CO2 24 06/22/2019   Lab Results  Component Value Date   ALT 14 06/22/2019   AST 15 06/22/2019   ALKPHOS 94 06/22/2019   BILITOT 0.3 06/22/2019   Lab Results  Component Value Date   HGBA1C 5.9 (H) 06/22/2019   Lab Results  Component Value Date   INSULIN 7.3 06/22/2019   INSULIN 18.2 03/03/2019   Lab Results  Component Value Date   TSH 1.220 03/03/2019   Lab Results  Component Value Date   CHOL 187 06/22/2019   HDL 51 06/22/2019   LDLCALC 112 (H) 06/22/2019   TRIG 137 06/22/2019   Lab Results  Component Value Date   WBC 6.9 06/22/2019   HGB 12.2 06/22/2019   HCT 38.1 06/22/2019   MCV 92 06/22/2019   PLT 284 06/22/2019   No results found for: IRON, TIBC, FERRITIN  Attestation Statements:   Reviewed by clinician on day of visit: allergies,  medications, problem list, medical history, surgical history, family history, social history, and previous encounter notes.  I, Michaelene Song, am acting as transcriptionist for Coralie Common, MD   I have reviewed the above documentation for accuracy and completeness, and I agree with the above. - Ilene Qua, MD

## 2019-10-08 ENCOUNTER — Ambulatory Visit (INDEPENDENT_AMBULATORY_CARE_PROVIDER_SITE_OTHER): Payer: 59 | Admitting: Family Medicine

## 2019-10-08 ENCOUNTER — Other Ambulatory Visit: Payer: Self-pay

## 2019-10-08 ENCOUNTER — Encounter (INDEPENDENT_AMBULATORY_CARE_PROVIDER_SITE_OTHER): Payer: Self-pay | Admitting: Family Medicine

## 2019-10-08 VITALS — BP 110/69 | HR 85 | Temp 97.8°F | Ht 63.0 in | Wt 166.0 lb

## 2019-10-08 DIAGNOSIS — R7303 Prediabetes: Secondary | ICD-10-CM | POA: Diagnosis not present

## 2019-10-08 DIAGNOSIS — Z9189 Other specified personal risk factors, not elsewhere classified: Secondary | ICD-10-CM

## 2019-10-08 DIAGNOSIS — E538 Deficiency of other specified B group vitamins: Secondary | ICD-10-CM

## 2019-10-08 DIAGNOSIS — E669 Obesity, unspecified: Secondary | ICD-10-CM

## 2019-10-08 DIAGNOSIS — Z6830 Body mass index (BMI) 30.0-30.9, adult: Secondary | ICD-10-CM

## 2019-10-08 DIAGNOSIS — E559 Vitamin D deficiency, unspecified: Secondary | ICD-10-CM

## 2019-10-08 MED ORDER — CYANOCOBALAMIN 1000 MCG/ML IJ SOLN: 1000.0000 ug | INTRAMUSCULAR | 1 refills | Status: DC

## 2019-10-08 MED ORDER — VITAMIN D (ERGOCALCIFEROL) 1.25 MG (50000 UNIT) PO CAPS: 50000.0000 [IU] | ORAL_CAPSULE | ORAL | 0 refills | Status: DC

## 2019-10-09 LAB — COMPREHENSIVE METABOLIC PANEL
ALT: 20 IU/L (ref 0–32)
AST: 15 IU/L (ref 0–40)
Albumin/Globulin Ratio: 1.4 (ref 1.2–2.2)
Albumin: 4.5 g/dL (ref 3.8–4.8)
Alkaline Phosphatase: 96 IU/L (ref 39–117)
BUN/Creatinine Ratio: 14 (ref 9–23)
BUN: 8 mg/dL (ref 6–24)
Bilirubin Total: 0.4 mg/dL (ref 0.0–1.2)
CO2: 24 mmol/L (ref 20–29)
Calcium: 9.8 mg/dL (ref 8.7–10.2)
Chloride: 107 mmol/L — ABNORMAL HIGH (ref 96–106)
Creatinine, Ser: 0.59 mg/dL (ref 0.57–1.00)
GFR calc Af Amer: 127 mL/min/{1.73_m2} (ref >59)
GFR calc non Af Amer: 110 mL/min/{1.73_m2} (ref >59)
Globulin, Total: 3.2 g/dL (ref 1.5–4.5)
Glucose: 93 mg/dL (ref 65–99)
Potassium: 4.7 mmol/L (ref 3.5–5.2)
Sodium: 141 mmol/L (ref 134–144)
Total Protein: 7.7 g/dL (ref 6.0–8.5)

## 2019-10-09 LAB — LIPID PANEL WITH LDL/HDL RATIO
Cholesterol, Total: 175 mg/dL (ref 100–199)
HDL: 48 mg/dL (ref >39)
LDL Chol Calc (NIH): 111 mg/dL — ABNORMAL HIGH (ref 0–99)
LDL/HDL Ratio: 2.3 ratio (ref 0.0–3.2)
Triglycerides: 84 mg/dL (ref 0–149)
VLDL Cholesterol Cal: 16 mg/dL (ref 5–40)

## 2019-10-09 LAB — HEMOGLOBIN A1C
Est. average glucose Bld gHb Est-mCnc: 111 mg/dL
Hgb A1c MFr Bld: 5.5 % (ref 4.8–5.6)

## 2019-10-09 LAB — VITAMIN D 25 HYDROXY (VIT D DEFICIENCY, FRACTURES): Vit D, 25-Hydroxy: 69.2 ng/mL (ref 30.0–100.0)

## 2019-10-09 LAB — VITAMIN B12: Vitamin B-12: 565 pg/mL (ref 232–1245)

## 2019-10-09 LAB — INSULIN, RANDOM: INSULIN: 6.4 u[IU]/mL (ref 2.6–24.9)

## 2019-10-12 NOTE — Progress Notes (Signed)
Chief Complaint:   OBESITY Nancy Sanders is here to discuss her progress with her obesity treatment plan along with follow-up of her obesity related diagnoses. [name] is on the Category 3 Plan and states she is following her eating plan approximately 70% of the time. Blanca states she is using resistance bands for 10-15 minutes 3 times per week.  Today's visit was #: 12 Starting weight: 194 lbs Starting date: 03/03/2019 Today's weight: 166 lbs Today's date: 10/08/2019 Total lbs lost to date: 28 bs Total lbs lost since last in-office visit: 0  Interim History: [name] has been following the plan almost completely.  She has an egg for breakfast and then a microawave meal at lunch and meat and vegetables for dinner.  She said for the most part she gets in around 8 ounces of meat for dinner.  She has cherry tomatoes and pickles for a snack.  She is not sure if she is getting all of her snack calories in.  Subjective:   1. B12 deficiency She notes fatigue. She is not a vegetarian.  She does not have a previous diagnosis of pernicious anemia.  She does not have a history of weight loss surgery.  Previous B12 level was 402.  She is getting B12 injections.  Lab Results  Component Value Date   [id number] 10/08/2019   2. Vitamin D deficiency [name]'s Vitamin D level was 49.4 on 06/22/2019. She is currently taking prescription vitamin D 50,000 IU each week. She denies nausea, vomiting or muscle weakness.  She endorses fatigue.  3. Prediabetes [name] has a diagnosis of prediabetes based on her elevated HgA1c and was informed this puts her at greater risk of developing diabetes. She continues to work on diet and exercise to decrease her risk of diabetes. She denies nausea or hypoglycemia.  She is not on metformin.  Lab Results  Component Value Date   HGBA1C 5.5 10/08/2019   Lab Results  Component Value Date   INSULIN 6.4 10/08/2019   INSULIN 7.3 06/22/2019   INSULIN 18.2  03/03/2019   4. At risk for diabetes mellitus [name] is at higher than average risk for developing diabetes due to her obesity.   Assessment/Plan:   1. B12 deficiency The diagnosis was reviewed with the patient. Counseling provided today, see below. We will continue to monitor. Orders and follow up as documented in patient record.  Counseling . The body needs vitamin B12: to make red blood cells; to make DNA; and to help the nerves work properly so they can carry messages from the brain to the body.  . The main causes of vitamin B12 deficiency include dietary deficiency, digestive diseases, pernicious anemia, and having a surgery in which part of the stomach or small intestine is removed.  . Certain medicines can make it harder for the body to absorb vitamin B12. These medicines include: heartburn medications; some antibiotics; some medications used to treat diabetes, gout, and high cholesterol.  . In some cases, there are no symptoms of this condition. If the condition leads to anemia or nerve damage, various symptoms can occur, such as weakness or fatigue, shortness of breath, and numbness or tingling in your hands and feet.   . Treatment:  o May include taking vitamin B12 supplements.  o Avoid alcohol.  o Eat lots of healthy foods that contain vitamin B12: - Beef, pork, chicken, [place], and organ meats, such as liver.  - Seafood: This includes clams, rainbow trout, salmon, tuna, and haddock. Eggs.  -  Cereal and dairy products that are fortified: This means that vitamin B12 has been added to the food.  - cyanocobalamin (,VITAMIN B-12,) 1000 MCG/ML injection; Inject 1 mL (1,000 mcg total) into the muscle every 14 (fourteen) days.  Dispense: 1 mL; Refill: 1 - Vitamin B12  2. Vitamin D deficiency Low Vitamin D level contributes to fatigue and are associated with obesity, breast, and colon cancer. She agrees to continue to take prescription Vitamin D @50 ,000 IU every week and will follow-up  for routine testing of Vitamin D, at least 2-3 times per year to avoid over-replacement. - Vitamin D, Ergocalciferol, (DRISDOL) 1.25 MG (50000 UNIT) CAPS capsule; Take 1 capsule (50,000 Units total) by mouth every 7 (seven) days.  Dispense: 4 capsule; Refill: 0 - VITAMIN D 25 Hydroxy (Vit-D Deficiency, Fractures)  3. Prediabetes [name] will continue to work on weight loss, exercise, and decreasing simple carbohydrates to help decrease the risk of diabetes.  - Comprehensive metabolic panel - Hemoglobin A1c - Insulin, random - Lipid Panel With LDL/HDL Ratio  4. At risk for diabetes mellitus Smt. was given approximately 15 minutes of diabetes education and counseling today. We discussed intensive lifestyle modifications today with an emphasis on weight loss as well as increasing exercise and decreasing simple carbohydrates in her diet. We also reviewed medication options with an emphasis on risk versus benefit of those discussed.   Repetitive spaced learning was employed today to elicit superior memory formation and behavioral change.  5. Class 1 obesity with serious comorbidity and body mass index (BMI) of 30.0 to 30.9 in adult, unspecified obesity type [name] is currently in the action stage of change. As such, her goal is to continue with weight loss efforts. She has agreed to the Category 3 Plan.   Exercise goals: As is.  She will continue the same exercise to cement it into her routine/schedule.  Behavioral modification strategies: increasing lean protein intake, meal planning and cooking strategies and keeping healthy foods in the home.  [name] has agreed to follow-up with our clinic in 2 weeks. She was informed of the importance of frequent follow-up visits to maximize her success with intensive lifestyle modifications for her multiple health conditions.   [name] was informed we would discuss her lab results at her next visit unless there is a critical issue that needs to be  addressed sooner. [name] agreed to keep her next visit at the agreed upon time to discuss these results.  Objective:   Blood pressure 110/69, pulse 85, temperature 97.8 F (36.6 C), temperature source Oral, height 5\' 3"  (1.6 m), weight 166 lb (75.3 kg), SpO2 100 %. Body mass index is 29.41 kg/m.  General: Cooperative, alert, well developed, in no acute distress. HEENT: Conjunctivae and lids unremarkable. Cardiovascular: Regular rhythm.  Lungs: Normal work of breathing. Neurologic: No focal deficits.   Lab Results  Component Value Date   CREATININE 0.59 10/08/2019   BUN 8 10/08/2019   NA 141 10/08/2019   K 4.7 10/08/2019   CL 107 (H) 10/08/2019   CO2 24 10/08/2019   Lab Results  Component Value Date   ALT 20 10/08/2019   AST 15 10/08/2019   ALKPHOS 96 10/08/2019   BILITOT 0.4 10/08/2019   Lab Results  Component Value Date   HGBA1C 5.5 10/08/2019   HGBA1C 5.9 (H) 06/22/2019   Lab Results  Component Value Date   INSULIN 6.4 10/08/2019   INSULIN 7.3 06/22/2019   INSULIN 18.2 03/03/2019   Lab Results  Component Value  Date   TSH 1.220 03/03/2019   Lab Results  Component Value Date   CHOL 175 10/08/2019   HDL 48 10/08/2019   LDLCALC 111 (H) 10/08/2019   TRIG 84 10/08/2019   Lab Results  Component Value Date   WBC 6.9 06/22/2019   HGB 12.2 06/22/2019   HCT 38.1 06/22/2019   MCV 92 06/22/2019   PLT 284 06/22/2019   Attestation Statements:   Reviewed by clinician on day of visit: allergies, medications, problem list, medical history, surgical history, family history, social history, and previous encounter notes.  I, Water quality scientist, CMA, am acting as transcriptionist for Coralie Common, MD.  I have reviewed the above documentation for accuracy and completeness, and I agree with the above. - Jinny Blossom, MD

## 2019-11-04 ENCOUNTER — Encounter (INDEPENDENT_AMBULATORY_CARE_PROVIDER_SITE_OTHER): Payer: Self-pay | Admitting: Family Medicine

## 2019-11-04 ENCOUNTER — Other Ambulatory Visit: Payer: Self-pay

## 2019-11-04 ENCOUNTER — Ambulatory Visit (INDEPENDENT_AMBULATORY_CARE_PROVIDER_SITE_OTHER): Payer: 59 | Admitting: Family Medicine

## 2019-11-04 VITALS — BP 93/62 | HR 85 | Temp 98.1°F | Ht 63.0 in | Wt 167.0 lb

## 2019-11-04 DIAGNOSIS — E538 Deficiency of other specified B group vitamins: Secondary | ICD-10-CM

## 2019-11-04 DIAGNOSIS — Z9189 Other specified personal risk factors, not elsewhere classified: Secondary | ICD-10-CM | POA: Diagnosis not present

## 2019-11-04 DIAGNOSIS — R7303 Prediabetes: Secondary | ICD-10-CM | POA: Diagnosis not present

## 2019-11-04 DIAGNOSIS — E7849 Other hyperlipidemia: Secondary | ICD-10-CM

## 2019-11-04 DIAGNOSIS — I10 Essential (primary) hypertension: Secondary | ICD-10-CM

## 2019-11-04 DIAGNOSIS — E669 Obesity, unspecified: Secondary | ICD-10-CM

## 2019-11-04 DIAGNOSIS — E559 Vitamin D deficiency, unspecified: Secondary | ICD-10-CM

## 2019-11-04 DIAGNOSIS — Z6830 Body mass index (BMI) 30.0-30.9, adult: Secondary | ICD-10-CM

## 2019-11-04 MED ORDER — CYANOCOBALAMIN 1000 MCG/ML IJ SOLN: 1000.0000 ug | INTRAMUSCULAR | 0 refills | Status: DC

## 2019-11-04 MED ORDER — VITAMIN D (ERGOCALCIFEROL) 1.25 MG (50000 UNIT) PO CAPS: 50000.0000 [IU] | ORAL_CAPSULE | ORAL | 0 refills | Status: DC

## 2019-11-04 NOTE — Progress Notes (Signed)
Chief Complaint:   OBESITY Nancy Sanders is here to discuss her progress with her obesity treatment plan along with follow-up of her obesity related diagnoses. Nancy Sanders is on the Category 3 Plan and states she is following her eating plan approximately 70% of the time. Nancy Sanders states she is walking and jogging for 30-40 minutes 2-3 times per week.  Today's visit was #: 21 Starting weight: 194 lbs Starting date: 03/03/2019 Today's weight: 167 lbs Today's date: 11/04/2019 Total lbs lost to date: 27 Total lbs lost since last in-office visit: 0  Interim History: Nancy Sanders recently spent a week at [employer] for her birthday. She feels she is plateauing and she is worried that she is not doing well. She has questions about her recent labs, she states "I don't know what they mean".  Subjective:   1. Essential hypertension Tenille's primary care physician recently decreased her dose off blood pressure medications due to feeling a little dizzy. She denies any symptoms now, and she is doing well. I discussed labs with the patient today.  2. Pre-diabetes I reviewed labs with the patient today, and education was provided. Elcie was provided with counseling on prevention of the onset of diabetes mellitus.  3. Vitamin D deficiency Nancy Sanders is on Vit D and last Vit D level was 69.2.  4. Other hyperlipidemia Nancy Sanders's diagnosis is likely hereditary. I discussed labs with the patient today. Counseling was provided on LDL and HDL, as well as dietary and weight loss to possibly improve levels and decrease her cardiovascular risk.  5. B12 deficiency Nancy Sanders is on B12, and her last level was 565. I reviewed labs with the patient today.  6. At risk for diabetes mellitus Nancy Sanders is at higher than average risk for developing diabetes due to her obesity.   Assessment/Plan:   1. Essential hypertension Nancy Sanders is working on healthy weight loss, diet, and exercise to improve blood pressure control.  We will watch for signs of hypotension as she continues her lifestyle modifications. Nancy Sanders will continue her medications per her primary care physician, and continue home blood pressure monitoring.  2. Pre-diabetes Nancy Sanders will continue to work on weight loss, diet, exercise, and decreasing simple carbohydrates to help decrease the risk of diabetes.   3. Vitamin D deficiency Low Vitamin D level contributes to fatigue and are associated with obesity, breast, and colon cancer. Nancy Sanders agreed to decreased prescription Vitamin D to 50,000 IU every 14 days with no refills. She will follow-up for routine testing of Vitamin D, at least 2-3 times per year to avoid over-replacement. We will continue to monitor labs.  - Vitamin D, Ergocalciferol, (DRISDOL) 1.25 MG (50000 UNIT) CAPS capsule; Take 1 capsule (50,000 Units total) by mouth every 14 (fourteen) days.  Dispense: 2 capsule; Refill: 0  4. Other hyperlipidemia Cardiovascular risk and specific lipid/LDL goals reviewed. We discussed several lifestyle modifications today and Nancy Sanders will continue to work on diet, exercise and weight loss efforts. We will continue to monitor. Orders and follow up as documented in patient record.   5. B12 deficiency The diagnosis was reviewed with the patient. Counseling provided today, see below. We will continue to monitor. We will refill B12 for 1 month. Orders and follow up as documented in patient record.  - cyanocobalamin (,VITAMIN B-12,) 1000 MCG/ML injection; Inject 1 mL (1,000 mcg total) into the muscle every 14 (fourteen) days.  Dispense: 1 mL; Refill: 0  6. At risk for diabetes mellitus Nancy Sanders was given approximately 15 minutes of diabetes  education and counseling today. We discussed intensive lifestyle modifications today with an emphasis on weight loss as well as increasing exercise and decreasing simple carbohydrates in her diet. We also reviewed medication options with an emphasis on risk versus  benefit of those discussed.   Repetitive spaced learning was employed today to elicit superior memory formation and behavioral change.  7. Class 1 obesity with serious comorbidity and body mass index (BMI) of 30.0 to 30.9 in adult, unspecified obesity type Nancy Sanders is currently in the action stage of change. As such, her goal is to continue with weight loss efforts. She has agreed to the Category 3 Plan.   Exercise goals: As is.  Behavioral modification strategies: travel eating strategies and celebration eating strategies.  Nancy Sanders has agreed to follow-up with our clinic in 3 to 4 weeks. She was informed of the importance of frequent follow-up visits to maximize her success with intensive lifestyle modifications for her multiple health conditions.   Objective:   Blood pressure 93/62, pulse 85, temperature 98.1 F (36.7 C), temperature source Oral, height 5\' 3"  (1.6 m), weight 167 lb (75.8 kg), SpO2 100 %. Body mass index is 29.58 kg/m.  General: Cooperative, alert, well developed, in no acute distress. HEENT: Conjunctivae and lids unremarkable. Cardiovascular: Regular rhythm.  Lungs: Normal work of breathing. Neurologic: No focal deficits.   Lab Results  Component Value Date   CREATININE 0.59 10/08/2019   BUN 8 10/08/2019   NA 141 10/08/2019   K 4.7 10/08/2019   CL 107 (H) 10/08/2019   CO2 24 10/08/2019   Lab Results  Component Value Date   ALT 20 10/08/2019   AST 15 10/08/2019   ALKPHOS 96 10/08/2019   BILITOT 0.4 10/08/2019   Lab Results  Component Value Date   HGBA1C 5.5 10/08/2019   HGBA1C 5.9 (H) 06/22/2019   Lab Results  Component Value Date   INSULIN 6.4 10/08/2019   INSULIN 7.3 06/22/2019   INSULIN 18.2 03/03/2019   Lab Results  Component Value Date   TSH 1.220 03/03/2019   Lab Results  Component Value Date   CHOL 175 10/08/2019   HDL 48 10/08/2019   LDLCALC 111 (H) 10/08/2019   TRIG 84 10/08/2019   Lab Results  Component Value Date   WBC  6.9 06/22/2019   HGB 12.2 06/22/2019   HCT 38.1 06/22/2019   MCV 92 06/22/2019   PLT 284 06/22/2019   No results found for: IRON, TIBC, FERRITIN  Attestation Statements:   Reviewed by clinician on day of visit: allergies, medications, problem list, medical history, surgical history, family history, social history, and previous encounter notes.   I, Trixie Dredge, am acting as transcriptionist for Dennard Nip, MD.  I have reviewed the above documentation for accuracy and completeness, and I agree with the above. -  Dennard Nip, MD

## 2019-11-25 ENCOUNTER — Other Ambulatory Visit: Payer: Self-pay

## 2019-11-25 ENCOUNTER — Ambulatory Visit (INDEPENDENT_AMBULATORY_CARE_PROVIDER_SITE_OTHER): Payer: 59 | Admitting: Family Medicine

## 2019-11-25 ENCOUNTER — Encounter (INDEPENDENT_AMBULATORY_CARE_PROVIDER_SITE_OTHER): Payer: Self-pay | Admitting: Family Medicine

## 2019-11-25 VITALS — BP 107/70 | HR 70 | Temp 97.8°F | Ht 63.0 in | Wt 165.0 lb

## 2019-11-25 DIAGNOSIS — Z6830 Body mass index (BMI) 30.0-30.9, adult: Secondary | ICD-10-CM

## 2019-11-25 DIAGNOSIS — I10 Essential (primary) hypertension: Secondary | ICD-10-CM | POA: Diagnosis not present

## 2019-11-25 DIAGNOSIS — E669 Obesity, unspecified: Secondary | ICD-10-CM

## 2019-11-25 DIAGNOSIS — R7303 Prediabetes: Secondary | ICD-10-CM | POA: Diagnosis not present

## 2019-11-25 DIAGNOSIS — E7849 Other hyperlipidemia: Secondary | ICD-10-CM

## 2019-11-25 DIAGNOSIS — E559 Vitamin D deficiency, unspecified: Secondary | ICD-10-CM | POA: Diagnosis not present

## 2019-11-25 NOTE — Progress Notes (Signed)
Chief Complaint:   OBESITY Nancy Sanders is here to discuss her progress with her obesity treatment plan along with follow-up of her obesity related diagnoses. Nancy Sanders is on the Category 3 Plan and states she is following her eating plan approximately 80% of the time. Max states she is running for 30 minutes 3 times per week.  Today's visit was #: 14 Starting weight: 194 lbs Starting date: 03/03/2019 Today's weight: 165 lbs Today's date: 11/26/2019 Total lbs lost to date: 29 lbs Total lbs lost since last in-office visit: 2 lbs  Interim History: Nancy Sanders finds that eating yogurt everyday is boring, but she likes it.  She is using 300 calories on [employer] at night or dipping sauces.  On Father's Day, she made her husband spaghetti and meatballs from scratch.  She did not eat any garlic bread and had little pasta, mainly meatballs and sauce.    Subjective:   1. Essential hypertension Review: taking medications as instructed, no medication side effects noted, no chest pain on exertion, no dyspnea on exertion, no swelling of ankles.  Blood pressure is well-controlled.  Se has been taking 1/2 tablet of the 30 mg daily lisinopril for months now.  Tolerating it well.  Denies any new symptoms or concerns.  BP Readings from Last 3 Encounters:  11/25/19 107/70  11/04/19 93/62  10/08/19 110/69   2. Other hyperlipidemia Mckensey has hyperlipidemia and has been trying to improve her cholesterol levels with intensive lifestyle modification including a low saturated fat diet, exercise and weight loss. She denies any chest pain, claudication or myalgias.  Positive for strong family history of hyperlipidemia.  LDL is still elevated despite weight loss.  She is tolerating Lipitor well without side effects.  Lab Results  Component Value Date   ALT 20 10/08/2019   AST 15 10/08/2019   ALKPHOS 96 10/08/2019   BILITOT 0.4 10/08/2019   Lab Results  Component Value Date   CHOL 175 10/08/2019     HDL 48 10/08/2019   LDLCALC 111 (H) 10/08/2019   TRIG 84 10/08/2019   3. Vitamin D deficiency Nancy Sanders's Vitamin D level was 69.2 on 10/08/2019. She is currently taking prescription vitamin D 50,000 IU every other week. She denies nausea, vomiting or muscle weakness.  She is tolerating it well.  4. Pre-diabetes Nancy Sanders has a diagnosis of prediabetes based on her elevated HgA1c and was informed this puts her at greater risk of developing diabetes. She continues to work on diet and exercise to decrease her risk of diabetes. She denies nausea or hypoglycemia.    Lab Results  Component Value Date   HGBA1C 5.5 10/08/2019   Lab Results  Component Value Date   INSULIN 6.4 10/08/2019   INSULIN 7.3 06/22/2019   INSULIN 18.2 03/03/2019   Assessment/Plan:   1. Essential hypertension [name] is working on healthy weight loss and exercise to improve blood pressure control. We will watch for signs of hypotension as she continues her lifestyle modifications.  Continue 15 mg daily lisinopril for now.  Check blood pressure at home and will consider lowering dose to 10 mg at next office visit if it remains well-controlled.  Continue prudent nutritional plan and weight loss.  2. Other hyperlipidemia Cardiovascular risk and specific lipid/LDL goals reviewed.  We discussed several lifestyle modifications today and [name] will continue to work on diet, exercise and weight loss efforts. Orders and follow up as documented in patient record.  Continue cholesterol medication at current dose.  Continue prudent nutritional plan and weight loss.  Counseling Intensive lifestyle modifications are the first line treatment for this issue. . Dietary changes: Increase soluble fiber. Decrease simple carbohydrates. . Exercise changes: Moderate to vigorous-intensity aerobic activity 150 minutes per week if tolerated. . Lipid-lowering medications: see documented in medical record.  3. Vitamin D deficiency Low  Vitamin D level contributes to fatigue and are associated with obesity, breast, and colon cancer. She agrees to continue to take prescription Vitamin D @50 ,000 IU every other week and will follow-up for routine testing of Vitamin D, at least 2-3 times per year to avoid over-replacement.  Consider rechecking vitamin D level at next office visit.  Continue prudent nutritional plan and weight loss.  4. Pre-diabetes Nancy Sanders will continue to work on weight loss, exercise, and decreasing simple carbohydrates to help decrease the risk of diabetes.  Continue prudent nutritional plan and weight loss.   5. Class 1 obesity with serious comorbidity and body mass index (BMI) of 30.0 to 30.9 in adult, unspecified obesity type Nancy Sanders is currently in the action stage of change. As such, her goal is to continue with weight loss efforts. She has agreed to the Category 3 Plan.    No change except I have her Category 3 breakfast options and protein equivalent sheet.  Exercise goals: As is.  She runs 30 minutes every other day.  Recommend adding strength training.  Behavioral modification strategies: increasing water intake (especially when she eats more pickles and salty foods or is outside in the heat longer) and celebration eating strategies.  Nancy Sanders has agreed to follow-up with our clinic in 3 weeks. She was informed of the importance of frequent follow-up visits to maximize her success with intensive lifestyle modifications for her multiple health conditions.   Objective:   Blood pressure 107/70, pulse 70, temperature 97.8 F (36.6 C), temperature source Oral, height 5\' 3"  (1.6 m), weight 165 lb (74.8 kg), SpO2 99 %. Body mass index is 29.23 kg/m.  General: Cooperative, alert, well developed, in no acute distress. HEENT: Conjunctivae and lids unremarkable. Cardiovascular: Regular rhythm.  Lungs: Normal work of breathing. Neurologic: No focal deficits.   Lab Results  Component Value Date    CREATININE 0.59 10/08/2019   BUN 8 10/08/2019   NA 141 10/08/2019   K 4.7 10/08/2019   CL 107 (H) 10/08/2019   CO2 24 10/08/2019   Lab Results  Component Value Date   ALT 20 10/08/2019   AST 15 10/08/2019   ALKPHOS 96 10/08/2019   BILITOT 0.4 10/08/2019   Lab Results  Component Value Date   HGBA1C 5.5 10/08/2019   HGBA1C 5.9 (H) 06/22/2019   Lab Results  Component Value Date   INSULIN 6.4 10/08/2019   INSULIN 7.3 06/22/2019   INSULIN 18.2 03/03/2019   Lab Results  Component Value Date   TSH 1.220 03/03/2019   Lab Results  Component Value Date   CHOL 175 10/08/2019   HDL 48 10/08/2019   LDLCALC 111 (H) 10/08/2019   TRIG 84 10/08/2019   Lab Results  Component Value Date   WBC 6.9 06/22/2019   HGB 12.2 06/22/2019   HCT 38.1 06/22/2019   MCV 92 06/22/2019   PLT 284 06/22/2019   Attestation Statements:   Reviewed by clinician on day of visit: allergies, medications, problem list, medical history, surgical history, family history, social history, and previous encounter notes.  Time spent on visit including pre-visit chart review and post-visit care and charting was 35 minutes.  I, Water quality scientist, CMA, am acting as Location manager for [employer], DO.  I have reviewed the above documentation for accuracy and completeness, and I agree with the above. Mellody Dance, DO

## 2019-12-17 ENCOUNTER — Ambulatory Visit (INDEPENDENT_AMBULATORY_CARE_PROVIDER_SITE_OTHER): Payer: 59 | Admitting: Family Medicine

## 2019-12-17 ENCOUNTER — Encounter (INDEPENDENT_AMBULATORY_CARE_PROVIDER_SITE_OTHER): Payer: Self-pay | Admitting: Family Medicine

## 2019-12-17 ENCOUNTER — Other Ambulatory Visit: Payer: Self-pay

## 2019-12-17 VITALS — BP 96/62 | HR 62 | Temp 98.0°F | Ht 63.0 in | Wt 164.0 lb

## 2019-12-17 DIAGNOSIS — Z9189 Other specified personal risk factors, not elsewhere classified: Secondary | ICD-10-CM | POA: Diagnosis not present

## 2019-12-17 DIAGNOSIS — E538 Deficiency of other specified B group vitamins: Secondary | ICD-10-CM

## 2019-12-17 DIAGNOSIS — I10 Essential (primary) hypertension: Secondary | ICD-10-CM

## 2019-12-17 DIAGNOSIS — E66811 Obesity, class 1: Secondary | ICD-10-CM

## 2019-12-17 DIAGNOSIS — R0602 Shortness of breath: Secondary | ICD-10-CM

## 2019-12-17 DIAGNOSIS — E559 Vitamin D deficiency, unspecified: Secondary | ICD-10-CM

## 2019-12-17 DIAGNOSIS — Z6830 Body mass index (BMI) 30.0-30.9, adult: Secondary | ICD-10-CM

## 2019-12-17 DIAGNOSIS — E669 Obesity, unspecified: Secondary | ICD-10-CM

## 2019-12-17 MED ORDER — VITAMIN D (ERGOCALCIFEROL) 1.25 MG (50000 UNIT) PO CAPS: 50000.0000 [IU] | ORAL_CAPSULE | ORAL | 0 refills | Status: DC

## 2019-12-17 MED ORDER — "SYRINGE/NEEDLE (DISP) 25G X 5/8"" 3 ML MISC": 0 refills | Status: DC

## 2019-12-17 MED ORDER — CYANOCOBALAMIN 1000 MCG/ML IJ SOLN: 1000.0000 ug | INTRAMUSCULAR | 0 refills | Status: DC

## 2019-12-17 NOTE — Progress Notes (Signed)
Chief Complaint:   OBESITY Nancy Sanders is here to discuss her progress with her obesity treatment plan along with follow-up of her obesity related diagnoses. [name] is on the Category 3 Plan and states she is following her eating plan approximately 80% of the time. [name] states she is doing some running.  Today's visit was #: 15 Starting weight: 194 lbs Starting date: 03/03/2019 Today's weight: 164 lbs Today's date: 12/17/2019 Total lbs lost to date: 30 lbs Total lbs lost since last in-office visit: 1 lb  Interim History: Nancy Sanders is down 30 pounds today.  She says she is having some right knee pain.  It is inflamed, so she has not done any running this week.  She will be going to the beach this weekend.  Subjective:   1. Vitamin D deficiency Nancy Sanders's Vitamin D level was 69.2 on 10/08/2019. She is currently taking prescription vitamin D 50,000 IU each week. She denies nausea, vomiting or muscle weakness.  2. B12 deficiency She is not a vegetarian.  She does not have a previous diagnosis of pernicious anemia.  She does not have a history of weight loss surgery.  Nancy Sanders is getting monthly B12 injections.  Lab Results  Component Value Date   [id number] 10/08/2019   3. Essential hypertension Review: taking medications as instructed, no medication side effects noted, no chest pain on exertion, no dyspnea on exertion, no swelling of ankles.  Blood pressure is low today.  Denies symptoms.    BP Readings from Last 3 Encounters:  12/17/19 96/62  11/25/19 107/70  11/04/19 93/62   4. SOB (shortness of breath) on exertion Pernella complains of some shortness of breath on exertion.  New IC today.    5. At risk for complication associated with hypotension The patient is at a higher than average risk of hypotension due to weight loss.  Assessment/Plan:   1. Vitamin D deficiency Low Vitamin D level contributes to fatigue and are associated with obesity, breast, and colon  cancer. She agrees to continue to take prescription Vitamin D @50 ,000 IU every week and will follow-up for routine testing of Vitamin D, at least 2-3 times per year to avoid over-replacement.  - Vitamin D, Ergocalciferol, (DRISDOL) 1.25 MG (50000 UNIT) CAPS capsule; Take 1 capsule (50,000 Units total) by mouth every 14 (fourteen) days.  Dispense: 2 capsule; Refill: 0  2. B12 deficiency The diagnosis was reviewed with the patient. Counseling provided today, see below. We will continue to monitor. Orders and follow up as documented in patient record.  Counseling . The body needs vitamin B12: to make red blood cells; to make DNA; and to help the nerves work properly so they can carry messages from the brain to the body.  . The main causes of vitamin B12 deficiency include dietary deficiency, digestive diseases, pernicious anemia, and having a surgery in which part of the stomach or small intestine is removed.  . Certain medicines can make it harder for the body to absorb vitamin B12. These medicines include: heartburn medications; some antibiotics; some medications used to treat diabetes, gout, and high cholesterol.  . In some cases, there are no symptoms of this condition. If the condition leads to anemia or nerve damage, various symptoms can occur, such as weakness or fatigue, shortness of breath, and numbness or tingling in your hands and feet.   . Treatment:  o May include taking vitamin B12 supplements.  o Avoid alcohol.  o Eat lots of healthy foods that contain  vitamin B12: - Beef, pork, chicken, [place], and organ meats, such as liver.  - Seafood: This includes clams, rainbow trout, salmon, tuna, and haddock. Eggs.  - Cereal and dairy products that are fortified: This means that vitamin B12 has been added to the food.   - cyanocobalamin (,VITAMIN B-12,) 1000 MCG/ML injection; Inject 1 mL (1,000 mcg total) into the muscle every 14 (fourteen) days.  Dispense: 1 mL; Refill: 0 - SYRINGE-NEEDLE,  DISP, 3 ML 25G X 5/8" 3 ML MISC; Use to give b12 injections  Dispense: 50 each; Refill: 0  3. Essential hypertension Nancy Sanders is working on healthy weight loss and exercise to improve blood pressure control. We will watch for signs of hypotension as she continues her lifestyle modifications.  We discussed lowering lisinopril to 10 mg daily or stopping medication and monitoring.  4. SOB (shortness of breath) on exertion IC was performed today.  Calorie need has dropped from 1934 to 1711.  New calorie goal is 1200-1500 or Category 2.  5. At risk for complication associated with hypotension Nancy Sanders was given approximately 15 minutes of education and counseling today to help avoid hypotension. We discussed risks of hypotension with weight loss and signs of hypotension such as feeling lightheaded or unsteady.  Repetitive spaced learning was employed today to elicit superior memory formation and behavioral change.  6. Class 1 obesity with serious comorbidity and body mass index (BMI) of 30.0 to 30.9 in adult, unspecified obesity type Nancy Sanders is currently in the action stage of change. As such, her goal is to continue with weight loss efforts. She has agreed to the Category 2 Plan.   Exercise goals: For substantial health benefits, adults should do at least 150 minutes (2 hours and 30 minutes) a week of moderate-intensity, or 75 minutes (1 hour and 15 minutes) a week of vigorous-intensity aerobic physical activity, or an equivalent combination of moderate- and vigorous-intensity aerobic activity. Aerobic activity should be performed in episodes of at least 10 minutes, and preferably, it should be spread throughout the week.  Behavioral modification strategies: increasing lean protein intake and increasing water intake.  Nancy Sanders has agreed to follow-up with our clinic in 3 weeks. She was informed of the importance of frequent follow-up visits to maximize her success with intensive lifestyle  modifications for her multiple health conditions.   Objective:   Blood pressure 96/62, pulse 62, temperature 98 F (36.7 C), temperature source Oral, height 5\' 3"  (1.6 m), weight 164 lb (74.4 kg), SpO2 100 %. Body mass index is 29.05 kg/m.  General: Cooperative, alert, well developed, in no acute distress. HEENT: Conjunctivae and lids unremarkable. Cardiovascular: Regular rhythm.  Lungs: Normal work of breathing. Neurologic: No focal deficits.   Lab Results  Component Value Date   CREATININE 0.59 10/08/2019   BUN 8 10/08/2019   NA 141 10/08/2019   K 4.7 10/08/2019   CL 107 (H) 10/08/2019   CO2 24 10/08/2019   Lab Results  Component Value Date   ALT 20 10/08/2019   AST 15 10/08/2019   ALKPHOS 96 10/08/2019   BILITOT 0.4 10/08/2019   Lab Results  Component Value Date   HGBA1C 5.5 10/08/2019   HGBA1C 5.9 (H) 06/22/2019   Lab Results  Component Value Date   INSULIN 6.4 10/08/2019   INSULIN 7.3 06/22/2019   INSULIN 18.2 03/03/2019   Lab Results  Component Value Date   TSH 1.220 03/03/2019   Lab Results  Component Value Date   CHOL 175 10/08/2019   HDL  48 10/08/2019   LDLCALC 111 (H) 10/08/2019   TRIG 84 10/08/2019   Lab Results  Component Value Date   WBC 6.9 06/22/2019   HGB 12.2 06/22/2019   HCT 38.1 06/22/2019   MCV 92 06/22/2019   PLT 284 06/22/2019   Attestation Statements:   Reviewed by clinician on day of visit: allergies, medications, problem list, medical history, surgical history, family history, social history, and previous encounter notes.  I, Water quality scientist, CMA, am acting as transcriptionist for Briscoe Deutscher, DO  I have reviewed the above documentation for accuracy and completeness, and I agree with the above. Briscoe Deutscher, DO

## 2019-12-22 ENCOUNTER — Other Ambulatory Visit (HOSPITAL_BASED_OUTPATIENT_CLINIC_OR_DEPARTMENT_OTHER): Payer: Self-pay | Admitting: Obstetrics and Gynecology

## 2019-12-22 DIAGNOSIS — Z1231 Encounter for screening mammogram for malignant neoplasm of breast: Secondary | ICD-10-CM

## 2020-01-12 ENCOUNTER — Other Ambulatory Visit: Payer: Self-pay

## 2020-01-12 ENCOUNTER — Ambulatory Visit (INDEPENDENT_AMBULATORY_CARE_PROVIDER_SITE_OTHER): Payer: 59 | Admitting: Family Medicine

## 2020-01-12 ENCOUNTER — Encounter (INDEPENDENT_AMBULATORY_CARE_PROVIDER_SITE_OTHER): Payer: Self-pay | Admitting: Family Medicine

## 2020-01-12 VITALS — BP 115/72 | HR 58 | Temp 97.9°F | Ht 63.0 in | Wt 162.0 lb

## 2020-01-12 DIAGNOSIS — Z9189 Other specified personal risk factors, not elsewhere classified: Secondary | ICD-10-CM

## 2020-01-12 DIAGNOSIS — E538 Deficiency of other specified B group vitamins: Secondary | ICD-10-CM

## 2020-01-12 DIAGNOSIS — E669 Obesity, unspecified: Secondary | ICD-10-CM

## 2020-01-12 DIAGNOSIS — R7303 Prediabetes: Secondary | ICD-10-CM | POA: Diagnosis not present

## 2020-01-12 DIAGNOSIS — Z6830 Body mass index (BMI) 30.0-30.9, adult: Secondary | ICD-10-CM

## 2020-01-12 DIAGNOSIS — I10 Essential (primary) hypertension: Secondary | ICD-10-CM | POA: Diagnosis not present

## 2020-01-12 DIAGNOSIS — E559 Vitamin D deficiency, unspecified: Secondary | ICD-10-CM

## 2020-01-12 MED ORDER — LISINOPRIL 10 MG PO TABS: 10.0000 mg | ORAL_TABLET | Freq: Every day | ORAL | 0 refills | Status: DC

## 2020-01-12 NOTE — Progress Notes (Signed)
Chief Complaint:   OBESITY Nancy Sanders is here to discuss her progress with her obesity treatment plan along with follow-up of her obesity related diagnoses. Nancy Sanders is on the Category 2 Plan and states she is following her eating plan approximately 85-90% of the time. Nancy Sanders states she is doing cardio for 20-30 minutes 1-3 times per week.  Today's visit was #: 37 Starting weight: 194 lbs Starting date: 03/03/2019 Today's weight: 162 lbs Today's date: 01/12/2020 Total lbs lost to date: 32 lbs Total lbs lost since last in-office visit: 2 lbs Total weight loss percentage to date: -16.49%  Interim History: Nancy Sanders says she is hitting about 1000 calories per day and 80-100 grams of protein.  She is going on vacation next week.  She has been doing less exercise because she is having some knee pain and she says it is just too hot.  Subjective:   1. B12 deficiency  Lab Results  Component Value Date   [id number] 10/08/2019   2. Vitamin D deficiency Nancy Sanders's Vitamin D level was 69.2 on 10/08/2019. She is currently taking prescription vitamin D 50,000 IU each week.   3. Prediabetes  Lab Results  Component Value Date   HGBA1C 5.5 10/08/2019   Lab Results  Component Value Date   INSULIN 6.4 10/08/2019   INSULIN 7.3 06/22/2019   INSULIN 18.2 03/03/2019   4. Essential hypertension Review: taking medications as instructed, no medication side effects noted, no chest pain on exertion, no dyspnea on exertion, no swelling of ankles.  She is taking lisinopril 10 mg daily for blood pressure control.  BP Readings from Last 3 Encounters:  01/12/20 115/72  12/17/19 96/62  11/25/19 107/70   Assessment/Plan:   1. B12 deficiency The current medical regimen is effective;  continue present plan and medications.  2. Vitamin D deficiency At goal. Optimal goal > 50 ng/dL. There is also evidence to support a goal of >70 ng/dL in patients with cancer and heart disease. Plan: The  current medical regimen is effective;  continue present plan and medications.  3. Prediabetes Goal is HgbA1c < 5.7 and insulin level closer to 5. The current medical regimen is effective;  continue present plan and medications.  4. Essential hypertension Catina is working on healthy weight loss and exercise to improve blood pressure control. We will watch for signs of hypotension as she continues her lifestyle modifications.  The patient requests a refill of Lisinopril today. Will provide courtesy refill.   Orders - lisinopril (ZESTRIL) 10 MG tablet; Take 1 tablet (10 mg total) by mouth daily.  Dispense: 90 tablet; Refill: 0  5. At risk for heart disease Nancy Sanders was given approximately 15 minutes of coronary artery disease prevention counseling today. She is 49 y.o. female and has risk factors for heart disease including obesity, HTN, and IR. We discussed intensive lifestyle modifications today with an emphasis on specific weight loss instructions and strategies.   Repetitive spaced learning was employed today to elicit superior memory formation and behavioral change.  6. Class 1 obesity with serious comorbidity and body mass index (BMI) of 30.0 to 30.9 in adult, unspecified obesity type Nancy Sanders is currently in the action stage of change. As such, her goal is to continue with weight loss efforts. She has agreed to the Category 2 Plan.   Exercise goals: For substantial health benefits, adults should do at least 150 minutes (2 hours and 30 minutes) a week of moderate-intensity, or 75 minutes (1 hour and 15 minutes)  a week of vigorous-intensity aerobic physical activity, or an equivalent combination of moderate- and vigorous-intensity aerobic activity. Aerobic activity should be performed in episodes of at least 10 minutes, and preferably, it should be spread throughout the week.  Behavioral modification strategies: increasing lean protein intake and increasing water intake.  Nancy Sanders has  agreed to follow-up with our clinic in 4 weeks. She was informed of the importance of frequent follow-up visits to maximize her success with intensive lifestyle modifications for her multiple health conditions.   Objective:   Blood pressure 115/72, pulse (!) 58, temperature 97.9 F (36.6 C), temperature source Oral, height 5\' 3"  (1.6 m), weight 162 lb (73.5 kg), SpO2 99 %. Body mass index is 28.7 kg/m.  General: Cooperative, alert, well developed, in no acute distress. HEENT: Conjunctivae and lids unremarkable. Cardiovascular: Regular rhythm.  Lungs: Normal work of breathing. Neurologic: No focal deficits.   Lab Results  Component Value Date   CREATININE 0.59 10/08/2019   BUN 8 10/08/2019   NA 141 10/08/2019   K 4.7 10/08/2019   CL 107 (H) 10/08/2019   CO2 24 10/08/2019   Lab Results  Component Value Date   ALT 20 10/08/2019   AST 15 10/08/2019   ALKPHOS 96 10/08/2019   BILITOT 0.4 10/08/2019   Lab Results  Component Value Date   HGBA1C 5.5 10/08/2019   HGBA1C 5.9 (H) 06/22/2019   Lab Results  Component Value Date   INSULIN 6.4 10/08/2019   INSULIN 7.3 06/22/2019   INSULIN 18.2 03/03/2019   Lab Results  Component Value Date   TSH 1.220 03/03/2019   Lab Results  Component Value Date   CHOL 175 10/08/2019   HDL 48 10/08/2019   LDLCALC 111 (H) 10/08/2019   TRIG 84 10/08/2019   Lab Results  Component Value Date   WBC 6.9 06/22/2019   HGB 12.2 06/22/2019   HCT 38.1 06/22/2019   MCV 92 06/22/2019   PLT 284 06/22/2019   Attestation Statements:   Reviewed by clinician on day of visit: allergies, medications, problem list, medical history, surgical history, family history, social history, and previous encounter notes.  I, Water quality scientist, CMA, am acting as transcriptionist for Briscoe Deutscher, DO  I have reviewed the above documentation for accuracy and completeness, and I agree with the above. Briscoe Deutscher, DO

## 2020-01-13 ENCOUNTER — Ambulatory Visit (HOSPITAL_BASED_OUTPATIENT_CLINIC_OR_DEPARTMENT_OTHER)
Admission: RE | Admit: 2020-01-13 | Discharge: 2020-01-13 | Disposition: A | Discharge status: Home / Self Care | Payer: 59 | Source: Ambulatory Visit | Attending: Obstetrics and Gynecology | Admitting: Obstetrics and Gynecology

## 2020-01-13 DIAGNOSIS — Z1231 Encounter for screening mammogram for malignant neoplasm of breast: Secondary | ICD-10-CM | POA: Diagnosis present

## 2020-02-09 ENCOUNTER — Ambulatory Visit (INDEPENDENT_AMBULATORY_CARE_PROVIDER_SITE_OTHER): Payer: 59 | Admitting: Family Medicine

## 2020-02-29 ENCOUNTER — Ambulatory Visit (INDEPENDENT_AMBULATORY_CARE_PROVIDER_SITE_OTHER): Payer: 59 | Admitting: Family Medicine

## 2020-02-29 ENCOUNTER — Encounter (INDEPENDENT_AMBULATORY_CARE_PROVIDER_SITE_OTHER): Payer: Self-pay | Admitting: Family Medicine

## 2020-02-29 ENCOUNTER — Other Ambulatory Visit: Payer: Self-pay

## 2020-02-29 VITALS — BP 107/70 | HR 88 | Temp 97.9°F | Ht 63.0 in | Wt 164.0 lb

## 2020-02-29 DIAGNOSIS — E669 Obesity, unspecified: Secondary | ICD-10-CM

## 2020-02-29 DIAGNOSIS — I10 Essential (primary) hypertension: Secondary | ICD-10-CM

## 2020-02-29 DIAGNOSIS — Z9189 Other specified personal risk factors, not elsewhere classified: Secondary | ICD-10-CM

## 2020-02-29 DIAGNOSIS — E7849 Other hyperlipidemia: Secondary | ICD-10-CM

## 2020-02-29 DIAGNOSIS — E538 Deficiency of other specified B group vitamins: Secondary | ICD-10-CM | POA: Diagnosis not present

## 2020-02-29 DIAGNOSIS — E559 Vitamin D deficiency, unspecified: Secondary | ICD-10-CM | POA: Diagnosis not present

## 2020-02-29 DIAGNOSIS — Z6830 Body mass index (BMI) 30.0-30.9, adult: Secondary | ICD-10-CM

## 2020-02-29 MED ORDER — CYANOCOBALAMIN 1000 MCG/ML IJ SOLN: 1000.0000 ug | INTRAMUSCULAR | 0 refills | Status: DC

## 2020-02-29 MED ORDER — VITAMIN D (ERGOCALCIFEROL) 1.25 MG (50000 UNIT) PO CAPS: 50000.0000 [IU] | ORAL_CAPSULE | ORAL | 0 refills | Status: DC

## 2020-02-29 MED ORDER — "SYRINGE/NEEDLE (DISP) 25G X 5/8"" 3 ML MISC": 0 refills | Status: DC

## 2020-02-29 NOTE — Progress Notes (Signed)
Chief Complaint:   OBESITY Nancy Sanders is here to discuss her progress with her obesity treatment plan along with follow-up of her obesity related diagnoses. Nancy Sanders is on the Category 2 Plan and states she is following her eating plan approximately 50% of the time. Nancy Sanders states she is exercising for 0 minutes 0 times per week.  Today's visit was #: 35 Starting weight: 194 lbs Starting date: 03/03/2019 Today's weight: 164 lbs Today's date: 02/29/2020 Total lbs lost to date: 30 lbs Total weight loss percentage to date: -15.46%  Interim History: Nancy Sanders went on vacation recently. Unfortunately, when she returned, she developed COVID. Improved now.   Assessment/Plan:   1. Vitamin D deficiency Current vitamin D is 69.2, tested on 10/08/2019. Not at goal. Optimal goal > 50 ng/dL. There is also evidence to support a goal of >70 ng/dL in patients with cancer and heart disease. Plan: Continue Vitamin D @50 ,000 IU every week with follow-up for routine testing of Vitamin D at least 2-3 times per year to avoid over-replacement.  -Refill Vitamin D, Ergocalciferol, (DRISDOL) 1.25 MG (50000 UNIT) CAPS capsule; Take 1 capsule (50,000 Units total) by mouth every 14 (fourteen) days.  Dispense: 2 capsule; Refill: 0  2. B12 deficiency  Lab Results  Component Value Date   VITAMINB12 565 10/08/2019   -Refill cyanocobalamin (,VITAMIN B-12,) 1000 MCG/ML injection; Inject 1 mL (1,000 mcg total) into the muscle every 14 (fourteen) days.  Dispense: 1 mL; Refill: 0 -Refill SYRINGE-NEEDLE, DISP, 3 ML 25G X 5/8" 3 ML MISC; Use to give b12 injections  Dispense: 50 each; Refill: 0  3. Other hyperlipidemia LDL is at goal on current statin dose, patient has no side effects from medication and LFTS are normal. Continue Lipitor 80 mg daily and Zetia.  Consider Crestor going forward if needed.  Lab Results  Component Value Date   ALT 20 10/08/2019   AST 15 10/08/2019   ALKPHOS 96 10/08/2019   BILITOT 0.4  10/08/2019   Lab Results  Component Value Date   CHOL 175 10/08/2019   HDL 48 10/08/2019   LDLCALC 111 (H) 10/08/2019   TRIG 84 10/08/2019   4. Essential hypertension At goal. Medications: lisinopril. We will monitor for hypotension with continued weight loss.  Continue lisinopril.   BP Readings from Last 3 Encounters:  02/29/20 107/70  01/12/20 115/72  12/17/19 96/62   Lab Results  Component Value Date   CREATININE 0.59 10/08/2019   5. At risk for heart disease Nancy Sanders was given approximately 15 minutes of coronary artery disease prevention counseling today. She is 49 y.o. female and has risk factors for heart disease including obesity and HTN, HLD. We again reviewed intensive lifestyle modifications today with an emphasis on specific weight loss instructions and strategies.   6. Class 1 obesity with serious comorbidity and body mass index (BMI) of 30.0 to 30.9 in adult, unspecified obesity type  Nancy Sanders is currently in the action stage of change. As such, her goal is to continue with weight loss efforts. She has agreed to the Category 2 Plan.   Exercise goals: For substantial health benefits, adults should do at least 150 minutes (2 hours and 30 minutes) a week of moderate-intensity, or 75 minutes (1 hour and 15 minutes) a week of vigorous-intensity aerobic physical activity, or an equivalent combination of moderate- and vigorous-intensity aerobic activity. Aerobic activity should be performed in episodes of at least 10 minutes, and preferably, it should be spread throughout the week.   She  will be doing a 5k walk/run in mid December.    Behavioral modification strategies: increasing lean protein intake and decreasing simple carbohydrates.  Increase water and thinking about strength.  Melda has agreed to follow-up with our clinic in 4 weeks. She was informed of the importance of frequent follow-up visits to maximize her success with intensive lifestyle modifications for her  multiple health conditions.   See Care Everywhere for 9/21 labs.  Objective:   Blood pressure 107/70, pulse 88, temperature 97.9 F (36.6 C), temperature source Oral, height 5\' 3"  (1.6 m), weight 164 lb (74.4 kg), SpO2 99 %. Body mass index is 29.05 kg/m.  General: Cooperative, alert, well developed, in no acute distress. HEENT: Conjunctivae and lids unremarkable. Cardiovascular: Regular rhythm.  Lungs: Normal work of breathing. Neurologic: No focal deficits.   Lab Results  Component Value Date   CREATININE 0.59 10/08/2019   BUN 8 10/08/2019   NA 141 10/08/2019   K 4.7 10/08/2019   CL 107 (H) 10/08/2019   CO2 24 10/08/2019   Lab Results  Component Value Date   ALT 20 10/08/2019   AST 15 10/08/2019   ALKPHOS 96 10/08/2019   BILITOT 0.4 10/08/2019   Lab Results  Component Value Date   HGBA1C 5.5 10/08/2019   HGBA1C 5.9 (H) 06/22/2019   Lab Results  Component Value Date   INSULIN 6.4 10/08/2019   INSULIN 7.3 06/22/2019   INSULIN 18.2 03/03/2019   Lab Results  Component Value Date   TSH 1.220 03/03/2019   Lab Results  Component Value Date   CHOL 175 10/08/2019   HDL 48 10/08/2019   LDLCALC 111 (H) 10/08/2019   TRIG 84 10/08/2019   Lab Results  Component Value Date   WBC 6.9 06/22/2019   HGB 12.2 06/22/2019   HCT 38.1 06/22/2019   MCV 92 06/22/2019   PLT 284 06/22/2019   Attestation Statements:   Reviewed by clinician on day of visit: allergies, medications, problem list, medical history, surgical history, family history, social history, and previous encounter notes.  I, Water quality scientist, CMA, am acting as transcriptionist for Briscoe Deutscher, DO  I have reviewed the above documentation for accuracy and completeness, and I agree with the above. Briscoe Deutscher, DO

## 2020-03-28 ENCOUNTER — Other Ambulatory Visit: Payer: Self-pay

## 2020-03-28 ENCOUNTER — Ambulatory Visit (INDEPENDENT_AMBULATORY_CARE_PROVIDER_SITE_OTHER): Payer: 59 | Admitting: Family Medicine

## 2020-03-28 ENCOUNTER — Encounter (INDEPENDENT_AMBULATORY_CARE_PROVIDER_SITE_OTHER): Payer: Self-pay | Admitting: Family Medicine

## 2020-03-28 VITALS — BP 111/71 | HR 60 | Temp 97.8°F | Ht 63.0 in | Wt 165.0 lb

## 2020-03-28 DIAGNOSIS — E559 Vitamin D deficiency, unspecified: Secondary | ICD-10-CM

## 2020-03-28 DIAGNOSIS — E7849 Other hyperlipidemia: Secondary | ICD-10-CM

## 2020-03-28 DIAGNOSIS — E538 Deficiency of other specified B group vitamins: Secondary | ICD-10-CM | POA: Diagnosis not present

## 2020-03-28 DIAGNOSIS — Z9189 Other specified personal risk factors, not elsewhere classified: Secondary | ICD-10-CM | POA: Diagnosis not present

## 2020-03-28 DIAGNOSIS — Z6830 Body mass index (BMI) 30.0-30.9, adult: Secondary | ICD-10-CM

## 2020-03-28 DIAGNOSIS — E669 Obesity, unspecified: Secondary | ICD-10-CM

## 2020-03-28 MED ORDER — CYANOCOBALAMIN 1000 MCG/ML IJ SOLN: 1000.0000 ug | INTRAMUSCULAR | 0 refills | Status: DC

## 2020-03-28 MED ORDER — VITAMIN D (ERGOCALCIFEROL) 1.25 MG (50000 UNIT) PO CAPS: 50000.0000 [IU] | ORAL_CAPSULE | ORAL | 0 refills | Status: DC

## 2020-03-28 NOTE — Progress Notes (Signed)
Chief Complaint:   OBESITY Nancy Sanders is here to discuss her progress with her obesity treatment plan along with follow-up of her obesity related diagnoses.   Today's visit was #: 18 Starting weight: 194 lbs Starting date: 03/03/2019 Today's weight: 165 lbs Today's date: 03/28/2020 Total lbs lost to date: 29 lbs Body mass index is 29.23 kg/m.  Total weight loss percentage to date: -14.95%  Interim History: Nancy Sanders says she does well with breakfast and lunch.  She has been drinking enough water.  She says the moment she walks into her home, she wants food.  Time is a struggle for her.  She gets home at 5 pm and dinner is between 6 and 7.  She says that, while she would would like to lose 5-10 pounds, she is happy with maintaining.    Nutrition Plan: the Category 2 Plan for about 60-70% of the time.  Anti-obesity medications: None. Hunger is well controlled controlled. Cravings are well controlled controlled.  Activity: No consistent exercise at this time.  Assessment/Plan:   1. Vitamin D deficiency Current vitamin D is 69.2, tested on 10/08/2019. At goal. Optimal goal > 50 ng/dL.   Plan:  [x]   Continue Vitamin D @50 ,000 IU every week. []   Continue home supplement daily. [x]   Follow-up for routine testing of Vitamin D at least 2-3 times per year to avoid over-replacement. []   Monitored by PCP.  - Refill Vitamin D, Ergocalciferol, (DRISDOL) 1.25 MG (50000 UNIT) CAPS capsule; Take 1 capsule (50,000 Units total) by mouth every 14 (fourteen) days.  Dispense: 2 capsule; Refill: 0  2. B12 deficiency At goal. Nancy Sanders is currently getting B12 injections for her b12 deficiency. The current medical regimen is effective;  continue present plan and medications. We will continue to monitor symptoms as they relate to her weight loss journey.  - Refill cyanocobalamin (,VITAMIN B-12,) 1000 MCG/ML injection; Inject 1 mL (1,000 mcg total) into the muscle every 14 (fourteen) days.  Dispense: 1  mL; Refill: 0  Lab Results  Component Value Date   VITAMINB12 565 10/08/2019   3. Other hyperlipidemia Kiearra has hyperlipidemia and has been trying to improve her cholesterol levels with intensive lifestyle modification including a low saturated fat diet, exercise and weight loss. She denies any chest pain, claudication or myalgias.  Nancy Sanders is taking Zetia and Lipitor.  Lab Results  Component Value Date   ALT 20 10/08/2019   AST 15 10/08/2019   ALKPHOS 96 10/08/2019   BILITOT 0.4 10/08/2019   Lab Results  Component Value Date   CHOL 175 10/08/2019   HDL 48 10/08/2019   LDLCALC 111 (H) 10/08/2019   TRIG 84 10/08/2019   4. At risk for heart disease Arshia was given approximately 15 minutes of coronary artery disease prevention counseling today. She is 49 y.o. female and has risk factors for heart disease including obesity and HLD. We discussed intensive lifestyle modifications today with an emphasis on specific weight loss instructions and strategies. Repetitive spaced learning was employed today to elicit superior memory formation and behavioral change.  5. Class 1 obesity with serious comorbidity and body mass index (BMI) of 30.0 to 30.9 in adult, unspecified obesity type  Nancy Sanders is currently in the action stage of change. As such, her goal is to continue with weight loss efforts.   Nutrition goals: She has agreed to the Category 2 Plan.   Exercise goals: For substantial health benefits, adults should do at least 150 minutes (2 hours and 30  minutes) a week of moderate-intensity, or 75 minutes (1 hour and 15 minutes) a week of vigorous-intensity aerobic physical activity, or an equivalent combination of moderate- and vigorous-intensity aerobic activity. Aerobic activity should be performed in episodes of at least 10 minutes, and preferably, it should be spread throughout the week.  Behavioral modification strategies: increasing lean protein intake, decreasing simple  carbohydrates, increasing vegetables, increasing water intake, better snacking choices and emotional eating strategies.  Nancy Sanders has agreed to follow-up with our clinic in 4 weeks. She was informed of the importance of frequent follow-up visits to maximize her success with intensive lifestyle modifications for her multiple health conditions.   Objective:   Blood pressure 111/71, pulse 60, temperature 97.8 F (36.6 C), temperature source Oral, height 5\' 3"  (1.6 m), weight 165 lb (74.8 kg), SpO2 98 %. Body mass index is 29.23 kg/m.  General: Cooperative, alert, well developed, in no acute distress. HEENT: Conjunctivae and lids unremarkable. Cardiovascular: Regular rhythm.  Lungs: Normal work of breathing. Neurologic: No focal deficits.   Lab Results  Component Value Date   CREATININE 0.59 10/08/2019   BUN 8 10/08/2019   NA 141 10/08/2019   K 4.7 10/08/2019   CL 107 (H) 10/08/2019   CO2 24 10/08/2019   Lab Results  Component Value Date   ALT 20 10/08/2019   AST 15 10/08/2019   ALKPHOS 96 10/08/2019   BILITOT 0.4 10/08/2019   Lab Results  Component Value Date   HGBA1C 5.5 10/08/2019   HGBA1C 5.9 (H) 06/22/2019   Lab Results  Component Value Date   INSULIN 6.4 10/08/2019   INSULIN 7.3 06/22/2019   INSULIN 18.2 03/03/2019   Lab Results  Component Value Date   TSH 1.220 03/03/2019   Lab Results  Component Value Date   CHOL 175 10/08/2019   HDL 48 10/08/2019   LDLCALC 111 (H) 10/08/2019   TRIG 84 10/08/2019   Lab Results  Component Value Date   WBC 6.9 06/22/2019   HGB 12.2 06/22/2019   HCT 38.1 06/22/2019   MCV 92 06/22/2019   PLT 284 06/22/2019   Attestation Statements:   Reviewed by clinician on day of visit: allergies, medications, problem list, medical history, surgical history, family history, social history, and previous encounter notes.  I, Water quality scientist, CMA, am acting as transcriptionist for Briscoe Deutscher, DO  I have reviewed the above  documentation for accuracy and completeness, and I agree with the above. Briscoe Deutscher, DO

## 2020-04-23 ENCOUNTER — Other Ambulatory Visit (INDEPENDENT_AMBULATORY_CARE_PROVIDER_SITE_OTHER): Payer: Self-pay | Admitting: Family Medicine

## 2020-04-23 DIAGNOSIS — E559 Vitamin D deficiency, unspecified: Secondary | ICD-10-CM

## 2020-04-25 NOTE — Telephone Encounter (Signed)
Last OV with Dr Wallace 

## 2020-04-25 NOTE — Telephone Encounter (Signed)
Last OV with Dr Brown 

## 2020-04-28 ENCOUNTER — Ambulatory Visit (INDEPENDENT_AMBULATORY_CARE_PROVIDER_SITE_OTHER): Payer: 59 | Admitting: Family Medicine

## 2020-05-01 ENCOUNTER — Other Ambulatory Visit (INDEPENDENT_AMBULATORY_CARE_PROVIDER_SITE_OTHER): Payer: Self-pay | Admitting: Family Medicine

## 2020-05-01 DIAGNOSIS — E559 Vitamin D deficiency, unspecified: Secondary | ICD-10-CM

## 2020-05-02 NOTE — Telephone Encounter (Signed)
Dr.Wallace patient

## 2020-05-10 ENCOUNTER — Other Ambulatory Visit (INDEPENDENT_AMBULATORY_CARE_PROVIDER_SITE_OTHER): Payer: Self-pay | Admitting: Family Medicine

## 2020-05-10 ENCOUNTER — Encounter (INDEPENDENT_AMBULATORY_CARE_PROVIDER_SITE_OTHER): Payer: Self-pay

## 2020-05-10 DIAGNOSIS — I10 Essential (primary) hypertension: Secondary | ICD-10-CM

## 2020-05-10 NOTE — Telephone Encounter (Signed)
Message sent to pt.

## 2020-06-02 ENCOUNTER — Other Ambulatory Visit: Payer: Self-pay

## 2020-06-02 ENCOUNTER — Ambulatory Visit (INDEPENDENT_AMBULATORY_CARE_PROVIDER_SITE_OTHER): Payer: 59 | Admitting: Family Medicine

## 2020-06-02 ENCOUNTER — Encounter (INDEPENDENT_AMBULATORY_CARE_PROVIDER_SITE_OTHER): Payer: Self-pay | Admitting: Family Medicine

## 2020-06-02 VITALS — BP 105/69 | HR 75 | Temp 97.6°F | Ht 63.0 in | Wt 167.0 lb

## 2020-06-02 DIAGNOSIS — E538 Deficiency of other specified B group vitamins: Secondary | ICD-10-CM

## 2020-06-02 DIAGNOSIS — E669 Obesity, unspecified: Secondary | ICD-10-CM | POA: Diagnosis not present

## 2020-06-02 DIAGNOSIS — E7849 Other hyperlipidemia: Secondary | ICD-10-CM | POA: Diagnosis not present

## 2020-06-02 DIAGNOSIS — Z9189 Other specified personal risk factors, not elsewhere classified: Secondary | ICD-10-CM

## 2020-06-02 DIAGNOSIS — E559 Vitamin D deficiency, unspecified: Secondary | ICD-10-CM | POA: Diagnosis not present

## 2020-06-02 DIAGNOSIS — Z6830 Body mass index (BMI) 30.0-30.9, adult: Secondary | ICD-10-CM

## 2020-06-02 MED ORDER — CYANOCOBALAMIN 1000 MCG/ML IJ SOLN: 1000.0000 ug | INTRAMUSCULAR | 0 refills | Status: DC

## 2020-06-02 MED ORDER — VITAMIN D (ERGOCALCIFEROL) 1.25 MG (50000 UNIT) PO CAPS: ORAL_CAPSULE | ORAL | 0 refills | Status: DC

## 2020-06-07 NOTE — Progress Notes (Signed)
Chief Complaint:   OBESITY Nancy Sanders is here to discuss her progress with her obesity treatment plan along with follow-up of her obesity related diagnoses.   Today's visit was #: 23 Starting weight: 194 lbs Starting date: 03/03/2019 Today's weight: 167 lbs Today's date: 06/02/2020 Total lbs lost to date: 27 lbs Body mass index is 29.58 kg/m.   Interim History: Nancy Sanders continues to snack when she gets home.  She says she has been doing more snacking.  She has a standing desk and walks daily. Nutrition Plan: the Category 2 Plan for 20-30% of the time.  Activity: Walks at work daily.  Assessment/Plan:   1. Vitamin D deficiency At goal. Current vitamin D is 69.2, tested on 10/08/2019. Optimal goal > 50 ng/dL.   Plan:  [x]   Continue Vitamin D @50 ,000 IU every week. []   Continue home supplement daily. [x]   Follow-up for routine testing of Vitamin D at least 2-3 times per year to avoid over-replacement.  - Refill Vitamin D, Ergocalciferol, (DRISDOL) 1.25 MG (50000 UNIT) CAPS capsule; TAKE 1 CAPSULE BY MOUTH EVERY 14 DAYS  Dispense: 2 capsule; Refill: 0  2. B12 deficiency  Lab Results  Component Value Date   VITAMINB12 565 10/08/2019   - Refill cyanocobalamin (,VITAMIN B-12,) 1000 MCG/ML injection; Inject 1 mL (1,000 mcg total) into the muscle every 14 (fourteen) days.  Dispense: 2 mL; Refill: 0  3. Other hyperlipidemia Will monitor.  Last lipid panel was in 9/21 [facility] Everywhere).  Lipid-lowering medications: atorvastatin 80 mg daily.   Plan: Dietary changes: Increase soluble fiber. Decrease simple carbohydrates. Exercise changes: An average 40 minutes of moderate to vigorous-intensity aerobic activity 3 or 4 times per week.   Lab Results  Component Value Date   CHOL 175 10/08/2019   HDL 48 10/08/2019   LDLCALC 111 (H) 10/08/2019   TRIG 84 10/08/2019   Lab Results  Component Value Date   ALT 20 10/08/2019   AST 15 10/08/2019   ALKPHOS 96 10/08/2019   BILITOT 0.4  10/08/2019   The 10-year ASCVD risk score Nancy Sanders DC Jr., et al., 2013) is: 0.8%   Values used to calculate the score:     Age: 51 years     Sex: Female     Is Non-Hispanic African American: No     Diabetic: No     Tobacco smoker: No     Systolic Blood Pressure: 885 mmHg     Is BP treated: Yes     HDL Cholesterol: 55 MG/DL     Total Cholesterol: 190 MG/DL  4. At risk for heart disease [name] was given approximately 8 minutes of coronary artery disease prevention counseling today. She is 50 y.o. female and has risk factors for heart disease including obesity and HLD. We discussed intensive lifestyle modifications today with an emphasis on specific weight loss instructions and strategies. Repetitive spaced learning was employed today to elicit superior memory formation and behavioral change.  5. Class 1 obesity with serious comorbidity and body mass index (BMI) of 30.0 to 30.9 in adult, unspecified obesity type  Course: [name] is currently in the action stage of change. As such, her goal is to continue with weight loss efforts.   Nutrition goals: She has agreed to keeping a food journal and adhering to recommended goals of 1200 calories and 95 grams of protein.  Embrace snacks - have 2 smaller meals in the afternoon.  Exercise goals: For substantial health benefits, adults should do at least 150  minutes (2 hours and 30 minutes) a week of moderate-intensity, or 75 minutes (1 hour and 15 minutes) a week of vigorous-intensity aerobic physical activity, or an equivalent combination of moderate- and vigorous-intensity aerobic activity. Aerobic activity should be performed in episodes of at least 10 minutes, and preferably, it should be spread throughout the week.  Behavioral modification strategies: increasing lean protein intake, decreasing simple carbohydrates, increasing vegetables, increasing water intake and decreasing liquid calories.  Nancy Sanders has agreed to follow-up with our clinic in 4  weeks. She was informed of the importance of frequent follow-up visits to maximize her success with intensive lifestyle modifications for her multiple health conditions.   Objective:   Blood pressure 105/69, pulse 75, temperature 97.6 F (36.4 C), temperature source Oral, height 5\' 3"  (1.6 m), weight 167 lb (75.8 kg), SpO2 99 %. Body mass index is 29.58 kg/m.  General: Cooperative, alert, well developed, in no acute distress. HEENT: Conjunctivae and lids unremarkable. Cardiovascular: Regular rhythm.  Lungs: Normal work of breathing. Neurologic: No focal deficits.   Lab Results  Component Value Date   CREATININE 0.59 10/08/2019   BUN 8 10/08/2019   NA 141 10/08/2019   K 4.7 10/08/2019   CL 107 (H) 10/08/2019   CO2 24 10/08/2019   Lab Results  Component Value Date   ALT 20 10/08/2019   AST 15 10/08/2019   ALKPHOS 96 10/08/2019   BILITOT 0.4 10/08/2019   Lab Results  Component Value Date   HGBA1C 5.5 10/08/2019   HGBA1C 5.9 (H) 06/22/2019   Lab Results  Component Value Date   INSULIN 6.4 10/08/2019   INSULIN 7.3 06/22/2019   INSULIN 18.2 03/03/2019   Lab Results  Component Value Date   TSH 1.220 03/03/2019   Lab Results  Component Value Date   CHOL 175 10/08/2019   HDL 48 10/08/2019   LDLCALC 111 (H) 10/08/2019   TRIG 84 10/08/2019   Lab Results  Component Value Date   WBC 6.9 06/22/2019   HGB 12.2 06/22/2019   HCT 38.1 06/22/2019   MCV 92 06/22/2019   PLT 284 06/22/2019   Attestation Statements:   Reviewed by clinician on day of visit: allergies, medications, problem list, medical history, surgical history, family history, social history, and previous encounter notes.  I, Water quality scientist, CMA, am acting as transcriptionist for Briscoe Deutscher, DO  I have reviewed the above documentation for accuracy and completeness, and I agree with the above. Briscoe Deutscher, DO

## 2020-06-29 ENCOUNTER — Encounter (INDEPENDENT_AMBULATORY_CARE_PROVIDER_SITE_OTHER): Payer: Self-pay | Admitting: Family Medicine

## 2020-06-29 ENCOUNTER — Other Ambulatory Visit: Payer: Self-pay

## 2020-06-29 ENCOUNTER — Ambulatory Visit (INDEPENDENT_AMBULATORY_CARE_PROVIDER_SITE_OTHER): Payer: 59 | Admitting: Family Medicine

## 2020-06-29 VITALS — BP 99/63 | HR 73 | Temp 97.4°F | Ht 63.0 in | Wt 167.0 lb

## 2020-06-29 DIAGNOSIS — E669 Obesity, unspecified: Secondary | ICD-10-CM

## 2020-06-29 DIAGNOSIS — I10 Essential (primary) hypertension: Secondary | ICD-10-CM

## 2020-06-29 DIAGNOSIS — Z6830 Body mass index (BMI) 30.0-30.9, adult: Secondary | ICD-10-CM

## 2020-06-29 DIAGNOSIS — E559 Vitamin D deficiency, unspecified: Secondary | ICD-10-CM

## 2020-06-29 DIAGNOSIS — E538 Deficiency of other specified B group vitamins: Secondary | ICD-10-CM | POA: Diagnosis not present

## 2020-06-29 DIAGNOSIS — Z9189 Other specified personal risk factors, not elsewhere classified: Secondary | ICD-10-CM | POA: Diagnosis not present

## 2020-06-29 MED ORDER — VITAMIN D (ERGOCALCIFEROL) 1.25 MG (50000 UNIT) PO CAPS: ORAL_CAPSULE | ORAL | 0 refills | Status: DC

## 2020-06-29 MED ORDER — CYANOCOBALAMIN 1000 MCG/ML IJ SOLN: 1000.0000 ug | INTRAMUSCULAR | 0 refills | Status: DC

## 2020-07-04 NOTE — Progress Notes (Signed)
Chief Complaint:   OBESITY Nancy Sanders is here to discuss her progress with her obesity treatment plan along with follow-up of her obesity related diagnoses.   Today's visit was #: 20 Starting weight: 194 lbs Starting date: 03/03/2019 Today's weight: 167 lbs Today's date: 06/29/2020 Total lbs lost to date: 27 lbs Body mass index is 29.58 kg/m.  Total weight loss percentage to date: -13.92%  Interim History: Nancy Sanders is not hitting her protein goals.  She says she has been lax in journaling on the weekends as well.  She denies hunger or cravings.  Plan:  Focus on meal plan and adequate protein intake.  In the future, add exercise +/-.  Consider GLP-1 if amenable.  Nutrition Plan: keeping a food journal and adhering to recommended goals of 1200 calories and 95 grams of protein (following Category 2) for 65-70% of the time. Activity: None at this time. This patient is following the prescribed meal plan meal without concerns.  Food recall appears to be consistent with the prescribed plan.  When following the plan, hunger and cravings are well controlled.     Assessment/Plan:   Medications Discontinued During This Encounter  Medication Reason  . Vitamin D, Ergocalciferol, (DRISDOL) 1.25 MG (50000 UNIT) CAPS capsule Reorder  . cyanocobalamin (,VITAMIN B-12,) 1000 MCG/ML injection Reorder     Meds ordered this encounter  Medications  . cyanocobalamin (,VITAMIN B-12,) 1000 MCG/ML injection    Sig: Inject 1 mL (1,000 mcg total) into the muscle every 14 (fourteen) days.    Dispense:  2 mL    Refill:  0  . Vitamin D, Ergocalciferol, (DRISDOL) 1.25 MG (50000 UNIT) CAPS capsule    Sig: TAKE 1 CAPSULE BY MOUTH EVERY 14 DAYS    Dispense:  2 capsule    Refill:  0      1. Essential hypertension Low normal.  Medications: lisinopril 10 mg daily.  No symptoms or concerns.  Plan: Avoid buying foods that are: processed, frozen, or prepackaged to avoid excess salt. Ambulatory blood  pressure monitoring was encouraged daily or when feeling poorly.  She was instructed to keep a log for [place] to review at each office visit.  May need to decrease lisinopril dose at next visit.  BP Readings from Last 3 Encounters:  06/29/20 99/63  06/02/20 105/69  03/28/20 111/71   Lab Results  Component Value Date   CREATININE 0.59 10/08/2019   2. Vitamin D deficiency At goal. Current vitamin D is 69.2, tested on 10/08/2019. Optimal goal > 50 ng/dL. Plan: Continue to take prescription Vitamin D @50 ,000 IU every week as prescribed.  Follow-up for routine testing of Vitamin D, at least 2-3 times per year to avoid over-replacement.  - Refill Vitamin D, Ergocalciferol, (DRISDOL) 1.25 MG (50000 UNIT) CAPS capsule; TAKE 1 CAPSULE BY MOUTH EVERY 14 DAYS  Dispense: 2 capsule; Refill: 0  3. B12 deficiency Lab Results  Component Value Date   VITAMINB12 565 10/08/2019   Supplementation: Vitamin B12 1000 mcg/mL IM every 14 days.   Plan:  Continue current treatment recheck her B12 level with the next set of labs.  Refill vitamin B12 today, as per below.  - Refill cyanocobalamin (,VITAMIN B-12,) 1000 MCG/ML injection; Inject 1 mL (1,000 mcg total) into the muscle every 14 (fourteen) days.  Dispense: 2 mL; Refill: 0  4. At risk for malnutrition Nancy Sanders was given extensive malnutrition prevention education and counseling today of more than 10 minutes.  Counseled her that malnutrition refers to inappropriate  nutrients or not the right balance of nutrients for optimal health.  Discussed with Nancy Sanders that it is absolutely possible to be malnourished but yet obese.  Risk factors, including but not limited to, inappropriate dietary choices, difficulty with obtaining food due to physical or financial limitations, and various physical and mental health conditions were reviewed with Nancy Sanders.   5. Class 1 obesity with serious comorbidity and body mass index (BMI) of 30.0 to 30.9 in  adult, unspecified obesity type  Course: Nancy Sanders is currently in the action stage of change. As such, her goal is to continue with weight loss efforts.   Nutrition goals: She has agreed to- no change-->  keeping a food journal and adhering to recommended goals of 1200 calories and 90+ grams of protein.   Exercise goals: For substantial health benefits, adults should do at least 150 minutes (2 hours and 30 minutes) a week of moderate-intensity, or 75 minutes (1 hour and 15 minutes) a week of vigorous-intensity aerobic physical activity, or an equivalent combination of moderate- and vigorous-intensity aerobic activity. Aerobic activity should be performed in episodes of at least 10 minutes, and preferably, it should be spread throughout the week.  Behavioral modification strategies: increasing lean protein intake, increasing water intake and keeping healthy foods in the home.  Nancy Sanders has agreed to follow-up with our clinic in 2 weeks. She was informed of the importance of frequent follow-up visits to maximize her success with intensive lifestyle modifications for her multiple health conditions.   Objective:   Blood pressure 99/63, pulse 73, temperature (!) 97.4 F (36.3 C), height 5\' 3"  (1.6 m), weight 167 lb (75.8 kg), SpO2 99 %. Body mass index is 29.58 kg/m.  General: Cooperative, alert, well developed, in no acute distress. HEENT: Conjunctivae and lids unremarkable. Cardiovascular: Regular rhythm.  Lungs: Normal work of breathing. Neurologic: No focal deficits.   Lab Results  Component Value Date   CREATININE 0.59 10/08/2019   BUN 8 10/08/2019   NA 141 10/08/2019   K 4.7 10/08/2019   CL 107 (H) 10/08/2019   CO2 24 10/08/2019   Lab Results  Component Value Date   ALT 20 10/08/2019   AST 15 10/08/2019   ALKPHOS 96 10/08/2019   BILITOT 0.4 10/08/2019   Lab Results  Component Value Date   HGBA1C 5.5 10/08/2019   HGBA1C 5.9 (H) 06/22/2019   Lab Results  Component Value  Date   INSULIN 6.4 10/08/2019   INSULIN 7.3 06/22/2019   INSULIN 18.2 03/03/2019   Lab Results  Component Value Date   TSH 1.220 03/03/2019   Lab Results  Component Value Date   CHOL 175 10/08/2019   HDL 48 10/08/2019   LDLCALC 111 (H) 10/08/2019   TRIG 84 10/08/2019   Lab Results  Component Value Date   WBC 6.9 06/22/2019   HGB 12.2 06/22/2019   HCT 38.1 06/22/2019   MCV 92 06/22/2019   PLT 284 06/22/2019   Attestation Statements:   Reviewed by clinician on day of visit: allergies, medications, problem list, medical history, surgical history, family history, social history, and previous encounter notes.  I, Water quality scientist, CMA, am acting as Location manager for [employer], DO.  I have reviewed the above documentation for accuracy and completeness, and I agree with the above. Marjory Sneddon, D.O.  The [place] was signed into law in 2016 which includes the topic of electronic health records.  This provides immediate access to information in  MyChart.  This includes consultation notes, operative notes, office notes, lab results and pathology reports.  If you have any questions about what you read please let us know at your next visit so we can discuss your concerns and take corrective action if need be.  We are right here with you.

## 2020-07-05 ENCOUNTER — Ambulatory Visit (INDEPENDENT_AMBULATORY_CARE_PROVIDER_SITE_OTHER): Payer: 59 | Admitting: Family Medicine

## 2020-07-21 ENCOUNTER — Ambulatory Visit (INDEPENDENT_AMBULATORY_CARE_PROVIDER_SITE_OTHER): Payer: 59 | Admitting: Family Medicine

## 2020-07-21 ENCOUNTER — Encounter (INDEPENDENT_AMBULATORY_CARE_PROVIDER_SITE_OTHER): Payer: Self-pay | Admitting: Family Medicine

## 2020-07-21 ENCOUNTER — Other Ambulatory Visit: Payer: Self-pay

## 2020-07-21 VITALS — BP 111/72 | HR 68 | Temp 97.4°F | Ht 63.0 in | Wt 166.0 lb

## 2020-07-21 DIAGNOSIS — E669 Obesity, unspecified: Secondary | ICD-10-CM | POA: Diagnosis not present

## 2020-07-21 DIAGNOSIS — Z9189 Other specified personal risk factors, not elsewhere classified: Secondary | ICD-10-CM | POA: Insufficient documentation

## 2020-07-21 DIAGNOSIS — Z6830 Body mass index (BMI) 30.0-30.9, adult: Secondary | ICD-10-CM

## 2020-07-21 DIAGNOSIS — E538 Deficiency of other specified B group vitamins: Secondary | ICD-10-CM

## 2020-07-21 DIAGNOSIS — I10 Essential (primary) hypertension: Secondary | ICD-10-CM | POA: Diagnosis not present

## 2020-07-21 DIAGNOSIS — E559 Vitamin D deficiency, unspecified: Secondary | ICD-10-CM

## 2020-07-21 MED ORDER — CYANOCOBALAMIN 1000 MCG/ML IJ SOLN: 1000.0000 ug | INTRAMUSCULAR | 0 refills | Status: DC

## 2020-07-21 MED ORDER — VITAMIN D (ERGOCALCIFEROL) 1.25 MG (50000 UNIT) PO CAPS: ORAL_CAPSULE | ORAL | 0 refills | Status: DC

## 2020-07-25 NOTE — Progress Notes (Signed)
Chief Complaint:   OBESITY Nancy Sanders is here to discuss her progress with her obesity treatment plan along with follow-up of her obesity related diagnoses.   Today's visit was #: 21 Starting weight: 194 lbs Starting date: 03/03/2019 Today's weight: 166 lbs Today's date: 07/21/2020 Total lbs lost to date: 28 lbs Body mass index is 29.41 kg/m.  Total weight loss percentage to date: -14.43%  Interim History:  Nancy Sanders's last office visit was 3 weeks ago.  She says she had some challenging days (Super Bowl, husband's birthday, Valentine's Day).  She does not journal as well on weekends.  She says that 50% of the time, she hit her dietary/journaling goals.  Current Meal Plan: keeping a food journal and adhering to recommended goals of 1200 calories and 90 grams of protein protein for 65% of the time.  Current Exercise Plan: None. Current Anti-Obesity Medications: None.   Assessment/Plan:   No orders of the defined types were placed in this encounter.   Medications Discontinued During This Encounter  Medication Reason  . cyanocobalamin (,VITAMIN B-12,) 1000 MCG/ML injection Reorder  . Vitamin D, Ergocalciferol, (DRISDOL) 1.25 MG (50000 UNIT) CAPS capsule Reorder     Meds ordered this encounter  Medications  . cyanocobalamin (,VITAMIN B-12,) 1000 MCG/ML injection    Sig: Inject 1 mL (1,000 mcg total) into the muscle every 14 (fourteen) days.    Dispense:  2 mL    Refill:  0  . Vitamin D, Ergocalciferol, (DRISDOL) 1.25 MG (50000 UNIT) CAPS capsule    Sig: TAKE 1 CAPSULE BY MOUTH EVERY 14 DAYS    Dispense:  2 capsule    Refill:  0     1. B12 deficiency Lab Results  Component Value Date   VITAMINB12 565 10/08/2019   Supplementation:  Vitamin B12 injection every 14 days.   Plan:  Will refill vitamin B12 today, as per below.    - Refill cyanocobalamin (,VITAMIN B-12,) 1000 MCG/ML injection; Inject 1 mL (1,000 mcg total) into the muscle every 14 (fourteen) days.   Dispense: 2 mL; Refill: 0  2. Vitamin D deficiency At goal. Current vitamin D is 69.2, tested on 10/08/2019. Optimal goal > 50 ng/dL.  She is taking vitamin D 50,000 IU every 14 days.  Plan: Continue to take prescription Vitamin D @50 ,000 IU every 14 days as prescribed.  Follow-up for routine testing of Vitamin D, at least 2-3 times per year to avoid over-replacement.  - Refill Vitamin D, Ergocalciferol, (DRISDOL) 1.25 MG (50000 UNIT) CAPS capsule; TAKE 1 CAPSULE BY MOUTH EVERY 14 DAYS  Dispense: 2 capsule; Refill: 0  3. Essential hypertension At goal. Medications: lisinopril 10 mg daily.  No symptoms or concerns.  She does not have a blood pressure cuff at home.  Plan:  Ambulatory blood pressure monitoring was encouraged with a goal of at least 2-3 times weekly or when feeling poorly.  Continue medication with no change in dose.    BP Readings from Last 3 Encounters:  07/21/20 111/72  06/29/20 99/63  06/02/20 105/69   Lab Results  Component Value Date   CREATININE 0.59 10/08/2019   4. At risk for complication associated with hypotension Nancy Sanders was given approximately 8 minutes of education and counseling today regarding the condition of hypotension, the pathophysiology of the condition and concerns regarding prevention and treatment of this condition.  We discussed risks of medications used to treat hypertension, which in the setting of weight loss, can inadvertently cause hypotension.  Signs of  hypotension such as feeling lightheaded or unsteady, esp when getting up first thing in the AM or off the toilet, were reviewed in detail and all questions were answered.  The use of motivational interviewing was employed as a technique as well today to aid in the treatment of patient's multiple conditions.  Pt understands importance of proper hydration and also of more prudent home BP monitoring while actively losing weight.  she will call us, or their PCP,  or other specialists who treat their BP  with medications, with any questions or concerns that may develop.   5. Class 1 obesity with serious comorbidity and body mass index (BMI) of 30.0 to 30.9 in adult, unspecified obesity type  Course: Nancy Sanders is currently in the action stage of change. As such, her goal is to continue with weight loss efforts.   Nutrition goals: She has agreed to keeping a food journal and adhering to recommended goals of 1200 calories and 90 grams of protein.   Exercise goals: All adults should avoid inactivity. Some physical activity is better than none, and adults who participate in any amount of physical activity gain some health benefits.  Behavioral modification strategies: planning for success and keeping a strict food journal.  Nancy Sanders has agreed to follow-up with our clinic in 2-3 weeks. She was informed of the importance of frequent follow-up visits to maximize her success with intensive lifestyle modifications for her multiple health conditions.   Objective:   Blood pressure 111/72, pulse 68, temperature (!) 97.4 F (36.3 C), height 5\' 3"  (1.6 m), weight 166 lb (75.3 kg), SpO2 98 %. Body mass index is 29.41 kg/m.  General: Cooperative, alert, well developed, in no acute distress. HEENT: Conjunctivae and lids unremarkable. Cardiovascular: Regular rhythm.  Lungs: Normal work of breathing. Neurologic: No focal deficits.   Lab Results  Component Value Date   CREATININE 0.59 10/08/2019   BUN 8 10/08/2019   NA 141 10/08/2019   K 4.7 10/08/2019   CL 107 (H) 10/08/2019   CO2 24 10/08/2019   Lab Results  Component Value Date   ALT 20 10/08/2019   AST 15 10/08/2019   ALKPHOS 96 10/08/2019   BILITOT 0.4 10/08/2019   Lab Results  Component Value Date   HGBA1C 5.5 10/08/2019   HGBA1C 5.9 (H) 06/22/2019   Lab Results  Component Value Date   INSULIN 6.4 10/08/2019   INSULIN 7.3 06/22/2019   INSULIN 18.2 03/03/2019   Lab Results  Component Value Date   TSH 1.220 03/03/2019   Lab  Results  Component Value Date   CHOL 175 10/08/2019   HDL 48 10/08/2019   LDLCALC 111 (H) 10/08/2019   TRIG 84 10/08/2019   Lab Results  Component Value Date   WBC 6.9 06/22/2019   HGB 12.2 06/22/2019   HCT 38.1 06/22/2019   MCV 92 06/22/2019   PLT 284 06/22/2019   Attestation Statements:   Reviewed by clinician on day of visit: allergies, medications, problem list, medical history, surgical history, family history, social history, and previous encounter notes.  I, Water quality scientist, CMA, am acting as Location manager for [employer], DO.  I have reviewed the above documentation for accuracy and completeness, and I agree with the above. Marjory Sneddon, D.O.  The [place] was signed into law in 2016 which includes the topic of electronic health records.  This provides immediate access to information in MyChart.  This includes consultation notes, operative notes, office notes, lab results and  pathology reports.  If you have any questions about what you read please let us know at your next visit so we can discuss your concerns and take corrective action if need be.  We are right here with you.

## 2020-08-09 ENCOUNTER — Ambulatory Visit (INDEPENDENT_AMBULATORY_CARE_PROVIDER_SITE_OTHER): Payer: 59 | Admitting: Family Medicine

## 2020-08-15 ENCOUNTER — Other Ambulatory Visit: Payer: Self-pay

## 2020-08-15 ENCOUNTER — Encounter (INDEPENDENT_AMBULATORY_CARE_PROVIDER_SITE_OTHER): Payer: Self-pay | Admitting: Family Medicine

## 2020-08-15 ENCOUNTER — Ambulatory Visit (INDEPENDENT_AMBULATORY_CARE_PROVIDER_SITE_OTHER): Payer: 59 | Admitting: Family Medicine

## 2020-08-15 VITALS — BP 113/74 | HR 71 | Temp 97.7°F | Ht 63.0 in | Wt 164.0 lb

## 2020-08-15 DIAGNOSIS — E538 Deficiency of other specified B group vitamins: Secondary | ICD-10-CM | POA: Diagnosis not present

## 2020-08-15 DIAGNOSIS — R7303 Prediabetes: Secondary | ICD-10-CM

## 2020-08-15 DIAGNOSIS — Z9189 Other specified personal risk factors, not elsewhere classified: Secondary | ICD-10-CM | POA: Diagnosis not present

## 2020-08-15 DIAGNOSIS — I10 Essential (primary) hypertension: Secondary | ICD-10-CM

## 2020-08-15 DIAGNOSIS — E669 Obesity, unspecified: Secondary | ICD-10-CM

## 2020-08-15 DIAGNOSIS — Z6834 Body mass index (BMI) 34.0-34.9, adult: Secondary | ICD-10-CM

## 2020-08-15 DIAGNOSIS — E559 Vitamin D deficiency, unspecified: Secondary | ICD-10-CM

## 2020-08-15 MED ORDER — VITAMIN D (ERGOCALCIFEROL) 1.25 MG (50000 UNIT) PO CAPS: ORAL_CAPSULE | ORAL | 0 refills | Status: DC

## 2020-08-15 MED ORDER — LISINOPRIL 10 MG PO TABS: 10.0000 mg | ORAL_TABLET | Freq: Every day | ORAL | 0 refills | Status: DC

## 2020-08-15 MED ORDER — CYANOCOBALAMIN 1000 MCG/ML IJ SOLN: 1000.0000 ug | INTRAMUSCULAR | 0 refills | Status: DC

## 2020-08-22 NOTE — Progress Notes (Signed)
Chief Complaint:   OBESITY Nancy Sanders is here to discuss her progress with her obesity treatment plan along with follow-up of her obesity related diagnoses.   Today's visit was #: 22 Starting weight: 194 lbs Starting date: 03/03/2019 Today's weight: 164 lbs Today's date: 08/15/2020 Total lbs lost to date: 30 lbs Body mass index is 29.05 kg/m.  Total weight loss percentage to date: -15.46%  Interim History:  Nancy Sanders is here for a follow up office visit and she is following the meal plan without concerns or issues.  Patient's meal and food recall appears to be accurate and consistent with what is on the plan.  When on plan, her hunger and cravings are well controlled.    Plan:  Journaling on weekdays and Category 2 on weekends, mostly.  Current Meal Plan: the Category 2 Plan and keeping a food journal and adhering to recommended goals of 1200 calories and 90 grams of protein for 65-70% of the time.  Current Exercise Plan: None.  Assessment/Plan:   Orders Placed This Encounter  Procedures  . Hemoglobin A1c  . Insulin, random  . VITAMIN D 25 Hydroxy (Vit-D Deficiency, Fractures)  . Vitamin B12    Medications Discontinued During This Encounter  Medication Reason  . lisinopril (ZESTRIL) 10 MG tablet Reorder  . cyanocobalamin (,VITAMIN B-12,) 1000 MCG/ML injection Reorder  . Vitamin D, Ergocalciferol, (DRISDOL) 1.25 MG (50000 UNIT) CAPS capsule Reorder     Meds ordered this encounter  Medications  . Vitamin D, Ergocalciferol, (DRISDOL) 1.25 MG (50000 UNIT) CAPS capsule    Sig: TAKE 1 CAPSULE BY MOUTH EVERY 14 DAYS    Dispense:  2 capsule    Refill:  0  . cyanocobalamin (,VITAMIN B-12,) 1000 MCG/ML injection    Sig: Inject 1 mL (1,000 mcg total) into the muscle every 14 (fourteen) days.    Dispense:  2 mL    Refill:  0  . lisinopril (ZESTRIL) 10 MG tablet    Sig: Take 1 tablet (10 mg total) by mouth daily.    Dispense:  30 tablet    Refill:  0     1.  Essential hypertension At goal. Medications: lisinopril 10 mg daily.  Not checking at home.  Denies symptoms or issues.  Feels well.  Plan:  Blood pressure at goal today/stable.  Avoid buying foods that are: processed, frozen, or prepackaged to avoid excess salt. We will continue to monitor closely alongside her PCP and/or Specialist.  Regular follow up with PCP and specialists was also encouraged.  Will refill lisinopril today, as per below.  BP Readings from Last 3 Encounters:  08/15/20 113/74  07/21/20 111/72  06/29/20 99/63   Lab Results  Component Value Date   CREATININE 0.59 10/08/2019   - Refill lisinopril (ZESTRIL) 10 MG tablet; Take 1 tablet (10 mg total) by mouth daily.  Dispense: 30 tablet; Refill: 0  2. B12 deficiency Lab Results  Component Value Date   VITAMINB12 565 10/08/2019   Supplementation: Vitamin B12 injections every 2 weeks.   Plan:  Continue current treatment.  Recheck her B12 level with the next set of labs.   - Refill cyanocobalamin (,VITAMIN B-12,) 1000 MCG/ML injection; Inject 1 mL (1,000 mcg total) into the muscle every 14 (fourteen) days.  Dispense: 2 mL; Refill: 0 - Vitamin B12  3. Vitamin D deficiency At goal. Current vitamin D is 69.2, tested on 10/08/2019. Optimal goal > 50 ng/dL.  She is taking vitamin D 50,000 IU  weekly.  Plan: Continue to take prescription Vitamin D @50 ,000 IU every week as prescribed.  Will check vitamin D level at next office visit.  - Refill Vitamin D, Ergocalciferol, (DRISDOL) 1.25 MG (50000 UNIT) CAPS capsule; TAKE 1 CAPSULE BY MOUTH EVERY 14 DAYS  Dispense: 2 capsule; Refill: 0 - VITAMIN D 25 Hydroxy (Vit-D Deficiency, Fractures)  4. Prediabetes At goal. Goal is HgbA1c < 5.7.  Medication: None.  Elevated insulin and A1c in the past.  Plan:  She will continue to focus on protein-rich, low simple carbohydrate foods. We reviewed the importance of hydration, regular exercise for stress reduction, and restorative sleep.  Will  check fasting insulin and A1c at next visit.  Lab Results  Component Value Date   HGBA1C 5.5 10/08/2019   Lab Results  Component Value Date   INSULIN 6.4 10/08/2019   INSULIN 7.3 06/22/2019   INSULIN 18.2 03/03/2019   - Hemoglobin A1c - Insulin, random  5. At risk for heart disease Due to Brittaney's current state of health and medical condition(s) and history of familial hypertension, hyperlipidemia, and prediabetes, she is at a higher risk for heart disease.  This puts the patient at much greater risk to subsequently develop cardiopulmonary conditions that can significantly affect patient's quality of life in a negative manner.    At least 8 minutes were spent on counseling Naliya about these concerns today, and I stressed the importance of reversing risks factors of obesity, especially truncal and visceral fat, hypertension, hyperlipidemia, and pre-diabetes.  The initial goal is to lose at least 5-10% of starting weight to help reduce these risk factors.  Counseling:  Intensive lifestyle modifications were discussed with Farha as the most appropriate first line of treatment.  she will continue to work on diet, exercise, and weight loss efforts.  We will continue to reassess these conditions on a fairly regular basis in an attempt to decrease the patient's overall morbidity and mortality.  Evidence-based interventions for health behavior change were utilized today including the discussion of self monitoring techniques, problem-solving barriers, and SMART goal setting techniques.  Specifically, regarding patient's less desirable eating habits and patterns, we employed the technique of small changes when Arely has not been able to fully commit to her prudent nutritional plan.  6. Obesity with current BMI 29.2  Course: Rupinder is currently in the action stage of change. As such, her goal is to continue with weight loss efforts.   Nutrition goals: She has agreed to the Category 2 Plan  and keeping a food journal and adhering to recommended goals of 1200 calories and 90 grams of protein.   Exercise goals: All adults should avoid inactivity. Some physical activity is better than none, and adults who participate in any amount of physical activity gain some health benefits.  Behavioral modification strategies: meal planning and cooking strategies and planning for success.  Shabana has agreed to follow-up with our clinic in 3-4 weeks. She was informed of the importance of frequent follow-up visits to maximize her success with intensive lifestyle modifications for her multiple health conditions.   Objective:   Blood pressure 113/74, pulse 71, temperature 97.7 F (36.5 C), height 5\' 3"  (1.6 m), weight 164 lb (74.4 kg), SpO2 99 %. Body mass index is 29.05 kg/m.  General: Cooperative, alert, well developed, in no acute distress. HEENT: Conjunctivae and lids unremarkable. Cardiovascular: Regular rhythm.  Lungs: Normal work of breathing. Neurologic: No focal deficits.   Lab Results  Component Value Date   CREATININE 0.59  10/08/2019   BUN 8 10/08/2019   NA 141 10/08/2019   K 4.7 10/08/2019   CL 107 (H) 10/08/2019   CO2 24 10/08/2019   Lab Results  Component Value Date   ALT 20 10/08/2019   AST 15 10/08/2019   ALKPHOS 96 10/08/2019   BILITOT 0.4 10/08/2019   Lab Results  Component Value Date   HGBA1C 5.5 10/08/2019   HGBA1C 5.9 (H) 06/22/2019   Lab Results  Component Value Date   INSULIN 6.4 10/08/2019   INSULIN 7.3 06/22/2019   INSULIN 18.2 03/03/2019   Lab Results  Component Value Date   TSH 1.220 03/03/2019   Lab Results  Component Value Date   CHOL 175 10/08/2019   HDL 48 10/08/2019   LDLCALC 111 (H) 10/08/2019   TRIG 84 10/08/2019   Lab Results  Component Value Date   WBC 6.9 06/22/2019   HGB 12.2 06/22/2019   HCT 38.1 06/22/2019   MCV 92 06/22/2019   PLT 284 06/22/2019   Attestation Statements:   Reviewed by clinician on day of visit:  allergies, medications, problem list, medical history, surgical history, family history, social history, and previous encounter notes.  I, Water quality scientist, CMA, am acting as Location manager for [employer], DO.  I have reviewed the above documentation for accuracy and completeness, and I agree with the above. Marjory Sneddon, D.O.  The [place] was signed into law in 2016 which includes the topic of electronic health records.  This provides immediate access to information in MyChart.  This includes consultation notes, operative notes, office notes, lab results and pathology reports.  If you have any questions about what you read please let us know at your next visit so we can discuss your concerns and take corrective action if need be.  We are right here with you.  Orders Placed This Encounter  Procedures  . Hemoglobin A1c  . Insulin, random  . VITAMIN D 25 Hydroxy (Vit-D Deficiency, Fractures)  . Vitamin B12    Medications Discontinued During This Encounter  Medication Reason  . lisinopril (ZESTRIL) 10 MG tablet Reorder  . cyanocobalamin (,VITAMIN B-12,) 1000 MCG/ML injection Reorder  . Vitamin D, Ergocalciferol, (DRISDOL) 1.25 MG (50000 UNIT) CAPS capsule Reorder     Meds ordered this encounter  Medications  . Vitamin D, Ergocalciferol, (DRISDOL) 1.25 MG (50000 UNIT) CAPS capsule    Sig: TAKE 1 CAPSULE BY MOUTH EVERY 14 DAYS    Dispense:  2 capsule    Refill:  0  . cyanocobalamin (,VITAMIN B-12,) 1000 MCG/ML injection    Sig: Inject 1 mL (1,000 mcg total) into the muscle every 14 (fourteen) days.    Dispense:  2 mL    Refill:  0  . lisinopril (ZESTRIL) 10 MG tablet    Sig: Take 1 tablet (10 mg total) by mouth daily.    Dispense:  30 tablet    Refill:  0

## 2020-09-19 ENCOUNTER — Other Ambulatory Visit (INDEPENDENT_AMBULATORY_CARE_PROVIDER_SITE_OTHER): Payer: Self-pay | Admitting: Family Medicine

## 2020-09-19 DIAGNOSIS — E538 Deficiency of other specified B group vitamins: Secondary | ICD-10-CM

## 2020-09-19 DIAGNOSIS — I10 Essential (primary) hypertension: Secondary | ICD-10-CM

## 2020-09-19 DIAGNOSIS — E559 Vitamin D deficiency, unspecified: Secondary | ICD-10-CM

## 2020-09-19 NOTE — Telephone Encounter (Signed)
Dr.Opalski ?

## 2020-09-20 ENCOUNTER — Other Ambulatory Visit: Payer: Self-pay

## 2020-09-20 ENCOUNTER — Ambulatory Visit (INDEPENDENT_AMBULATORY_CARE_PROVIDER_SITE_OTHER): Payer: 59 | Admitting: Family Medicine

## 2020-09-20 ENCOUNTER — Encounter (INDEPENDENT_AMBULATORY_CARE_PROVIDER_SITE_OTHER): Payer: Self-pay | Admitting: Family Medicine

## 2020-09-20 VITALS — BP 115/60 | HR 69 | Temp 97.7°F | Ht 63.0 in | Wt 165.0 lb

## 2020-09-20 DIAGNOSIS — E538 Deficiency of other specified B group vitamins: Secondary | ICD-10-CM

## 2020-09-20 DIAGNOSIS — Z9189 Other specified personal risk factors, not elsewhere classified: Secondary | ICD-10-CM | POA: Diagnosis not present

## 2020-09-20 DIAGNOSIS — E559 Vitamin D deficiency, unspecified: Secondary | ICD-10-CM

## 2020-09-20 DIAGNOSIS — R7303 Prediabetes: Secondary | ICD-10-CM | POA: Diagnosis not present

## 2020-09-20 DIAGNOSIS — E669 Obesity, unspecified: Secondary | ICD-10-CM

## 2020-09-20 DIAGNOSIS — Z6834 Body mass index (BMI) 34.0-34.9, adult: Secondary | ICD-10-CM

## 2020-09-20 DIAGNOSIS — E782 Mixed hyperlipidemia: Secondary | ICD-10-CM

## 2020-09-21 ENCOUNTER — Encounter (INDEPENDENT_AMBULATORY_CARE_PROVIDER_SITE_OTHER): Payer: Self-pay | Admitting: Family Medicine

## 2020-09-21 LAB — CMP14+EGFR
ALT: 14 IU/L (ref 0–32)
AST: 10 IU/L (ref 0–40)
Albumin/Globulin Ratio: 1.2 (ref 1.2–2.2)
Albumin: 4 g/dL (ref 3.8–4.8)
Alkaline Phosphatase: 77 IU/L (ref 44–121)
BUN/Creatinine Ratio: 16 (ref 9–23)
BUN: 10 mg/dL (ref 6–24)
Bilirubin Total: 0.6 mg/dL (ref 0.0–1.2)
CO2: 23 mmol/L (ref 20–29)
Calcium: 9.3 mg/dL (ref 8.7–10.2)
Chloride: 101 mmol/L (ref 96–106)
Creatinine, Ser: 0.61 mg/dL (ref 0.57–1.00)
Globulin, Total: 3.3 g/dL (ref 1.5–4.5)
Glucose: 97 mg/dL (ref 65–99)
Potassium: 4.3 mmol/L (ref 3.5–5.2)
Sodium: 139 mmol/L (ref 134–144)
Total Protein: 7.3 g/dL (ref 6.0–8.5)
eGFR: 111 mL/min/{1.73_m2} (ref >59)

## 2020-09-21 LAB — HEMOGLOBIN A1C
Est. average glucose Bld gHb Est-mCnc: 103 mg/dL
Hgb A1c MFr Bld: 5.2 % (ref 4.8–5.6)

## 2020-09-21 LAB — LIPID PANEL WITH LDL/HDL RATIO
Cholesterol, Total: 190 mg/dL (ref 100–199)
HDL: 56 mg/dL (ref >39)
LDL Chol Calc (NIH): 119 mg/dL — ABNORMAL HIGH (ref 0–99)
LDL/HDL Ratio: 2.1 ratio (ref 0.0–3.2)
Triglycerides: 83 mg/dL (ref 0–149)
VLDL Cholesterol Cal: 15 mg/dL (ref 5–40)

## 2020-09-21 LAB — VITAMIN B12: Vitamin B-12: 449 pg/mL (ref 232–1245)

## 2020-09-21 LAB — VITAMIN D 25 HYDROXY (VIT D DEFICIENCY, FRACTURES): Vit D, 25-Hydroxy: 58.5 ng/mL (ref 30.0–100.0)

## 2020-09-21 LAB — INSULIN, RANDOM: INSULIN: 4.6 u[IU]/mL (ref 2.6–24.9)

## 2020-09-21 LAB — TSH: TSH: 2.17 u[IU]/mL (ref 0.450–4.500)

## 2020-09-21 MED ORDER — VITAMIN D (ERGOCALCIFEROL) 1.25 MG (50000 UNIT) PO CAPS: ORAL_CAPSULE | ORAL | 1 refills | Status: DC

## 2020-09-21 MED ORDER — CYANOCOBALAMIN 1000 MCG/ML IJ SOLN: 1000.0000 ug | INTRAMUSCULAR | 1 refills | Status: DC

## 2020-09-22 NOTE — Progress Notes (Signed)
Chief Complaint:   OBESITY Nancy Sanders is here to discuss her progress with her obesity treatment plan along with follow-up of her obesity related diagnoses. Nancy Sanders is on the Category 2 Plan or keeping a food journal and adhering to recommended goals of 1200 calories and 90 grams of protein daily and states she is following her eating plan approximately 70% of the time. Nancy Sanders states she is jogging and walking for 30 minutes 3-4 times per week.  Today's visit was #: 23 Starting weight: 194 lbs Starting date: 03/03/2019 Today's weight: 165 lbs Today's date: 09/20/2020 Total lbs lost to date: 29 Total lbs lost since last in-office visit: 0  Interim History: Nancy Sanders has done well maintaining her weight loss. Her BMI is below 30, and her visceral fat rating is 8 (at goal) and her muscle mass is higher than her fat mass which puts her in a healthy weight for her. She is going on a cruise soon and would like to discuss cruise eating strategies.  Subjective:   1. Vitamin D deficiency Nancy Sanders is on Vit D prescription, and she is due for labs. She is at high risk of over-replacement with weight loss.  2. B12 deficiency Nancy Sanders is on B12, and she is due for labs. She requests a refill today.  3. Pre-diabetes Nancy Sanders is working on diet, exercise, and weight loss. She is due for labs.  4. Mixed hyperlipidemia Nancy Sanders is on Zetia and Liptior, and she is doing well with weight loss. She is due for labs.  5. At risk for impaired metabolic function Nancy Sanders is at increased risk for impaired metabolic function due to current nutrition and muscle mass.  Assessment/Plan:   1. Vitamin D deficiency Low Vitamin D level contributes to fatigue and are associated with obesity, breast, and colon cancer. We will check labs today, and if her Vit D >100 then stop Vit D altogether. We will refill prescription Vitamin D for 2 months. Nancy Sanders will follow-up for routine testing of Vitamin D, at least  2-3 times per year to avoid over-replacement.  - VITAMIN D 25 Hydroxy (Vit-D Deficiency, Fractures) - Vitamin D, Ergocalciferol, (DRISDOL) 1.25 MG (50000 UNIT) CAPS capsule; TAKE 1 CAPSULE BY MOUTH EVERY 14 DAYS  Dispense: 2 capsule; Refill: 1  2. B12 deficiency The diagnosis was reviewed with the patient. We will check labs today, and we will refill B12 for 2 months. Orders and follow up as documented in patient record.  - Vitamin B12 - cyanocobalamin (,VITAMIN B-12,) 1000 MCG/ML injection; Inject 1 mL (1,000 mcg total) into the muscle every 14 (fourteen) days.  Dispense: 2 mL; Refill: 1  3. Pre-diabetes Nancy Sanders will continue to work on weight loss, exercise, and decreasing simple carbohydrates to help decrease the risk of diabetes. We will check labs today.  - CMP14+EGFR - Insulin, random - Hemoglobin A1c - TSH  4. Mixed hyperlipidemia Cardiovascular risk and specific lipid/LDL goals reviewed. We discussed several lifestyle modifications today. We will check labs today. Nancy Sanders will continue to work on diet, exercise and weight loss efforts. Orders and follow up as documented in patient record.   - Lipid Panel With LDL/HDL Ratio  5. At risk for impaired metabolic function Nancy Sanders was given approximately 15 minutes of impaired  metabolic function prevention counseling today. We discussed intensive lifestyle modifications today with an emphasis on specific nutrition and exercise instructions and strategies.   Repetitive spaced learning was employed today to elicit superior memory formation and behavioral change.  6. Obesity  with current BMI of 29.3 Nancy Sanders is currently in the action stage of change. As such, her goal is to continue with weight loss efforts. She has agreed to the Category 2 Plan or keeping a food journal and adhering to recommended goals of 1200 calories and 90+ grams of protein daily.   Exercise goals: As is.  Behavioral modification strategies: increasing lean  protein intake, increasing water intake, meal planning and cooking strategies, holiday eating strategies  and celebration eating strategies.  Nancy Sanders has agreed to follow-up with our clinic in 6 to 7 weeks. She was informed of the importance of frequent follow-up visits to maximize her success with intensive lifestyle modifications for her multiple health conditions.   Nancy Sanders was informed we would discuss her lab results at her next visit unless there is a critical issue that needs to be addressed sooner. Nancy Sanders agreed to keep her next visit at the agreed upon time to discuss these results.  Objective:   Blood pressure 115/60, pulse 69, temperature 97.7 F (36.5 C), height 5' 3"  (1.6 m), weight 165 lb (74.8 kg), SpO2 100 %. Body mass index is 29.23 kg/m.  General: Cooperative, alert, well developed, in no acute distress. HEENT: Conjunctivae and lids unremarkable. Cardiovascular: Regular rhythm.  Lungs: Normal work of breathing. Neurologic: No focal deficits.   Lab Results  Component Value Date   CREATININE 0.61 09/20/2020   BUN 10 09/20/2020   NA 139 09/20/2020   K 4.3 09/20/2020   CL 101 09/20/2020   CO2 23 09/20/2020   Lab Results  Component Value Date   ALT 14 09/20/2020   AST 10 09/20/2020   ALKPHOS 77 09/20/2020   BILITOT 0.6 09/20/2020   Lab Results  Component Value Date   HGBA1C 5.2 09/20/2020   HGBA1C 5.5 10/08/2019   HGBA1C 5.9 (H) 06/22/2019   Lab Results  Component Value Date   INSULIN 4.6 09/20/2020   INSULIN 6.4 10/08/2019   INSULIN 7.3 06/22/2019   INSULIN 18.2 03/03/2019   Lab Results  Component Value Date   TSH 2.170 09/20/2020   Lab Results  Component Value Date   CHOL 190 09/20/2020   HDL 56 09/20/2020   LDLCALC 119 (H) 09/20/2020   TRIG 83 09/20/2020   Lab Results  Component Value Date   WBC 6.9 06/22/2019   HGB 12.2 06/22/2019   HCT 38.1 06/22/2019   MCV 92 06/22/2019   PLT 284 06/22/2019   No results found for: IRON, TIBC,  FERRITIN  Attestation Statements:   Reviewed by clinician on day of visit: allergies, medications, problem list, medical history, surgical history, family history, social history, and previous encounter notes.   I, Trixie Dredge, am acting as transcriptionist for Dennard Nip, MD.  I have reviewed the above documentation for accuracy and completeness, and I agree with the above. -  Dennard Nip, MD

## 2020-10-04 ENCOUNTER — Other Ambulatory Visit (INDEPENDENT_AMBULATORY_CARE_PROVIDER_SITE_OTHER): Payer: Self-pay | Admitting: Family Medicine

## 2020-10-04 DIAGNOSIS — I10 Essential (primary) hypertension: Secondary | ICD-10-CM

## 2020-10-04 NOTE — Telephone Encounter (Signed)
Pt last seen by Dr. Beasley.  

## 2020-10-11 NOTE — Telephone Encounter (Signed)
Ok x 1

## 2020-10-11 NOTE — Telephone Encounter (Signed)
Please review

## 2020-11-01 ENCOUNTER — Other Ambulatory Visit: Payer: Self-pay

## 2020-11-01 ENCOUNTER — Encounter (INDEPENDENT_AMBULATORY_CARE_PROVIDER_SITE_OTHER): Payer: Self-pay | Admitting: Family Medicine

## 2020-11-01 ENCOUNTER — Ambulatory Visit (INDEPENDENT_AMBULATORY_CARE_PROVIDER_SITE_OTHER): Payer: 59 | Admitting: Family Medicine

## 2020-11-01 VITALS — BP 98/66 | HR 100 | Temp 97.6°F | Ht 63.0 in | Wt 173.0 lb

## 2020-11-01 DIAGNOSIS — E538 Deficiency of other specified B group vitamins: Secondary | ICD-10-CM

## 2020-11-01 DIAGNOSIS — Z6834 Body mass index (BMI) 34.0-34.9, adult: Secondary | ICD-10-CM | POA: Diagnosis not present

## 2020-11-01 DIAGNOSIS — E559 Vitamin D deficiency, unspecified: Secondary | ICD-10-CM | POA: Diagnosis not present

## 2020-11-01 DIAGNOSIS — E669 Obesity, unspecified: Secondary | ICD-10-CM | POA: Diagnosis not present

## 2020-11-01 DIAGNOSIS — Z9189 Other specified personal risk factors, not elsewhere classified: Secondary | ICD-10-CM

## 2020-11-01 MED ORDER — CYANOCOBALAMIN 1000 MCG/ML IJ SOLN: 1000.0000 ug | INTRAMUSCULAR | 1 refills | Status: DC

## 2020-11-01 MED ORDER — VITAMIN D (ERGOCALCIFEROL) 1.25 MG (50000 UNIT) PO CAPS: ORAL_CAPSULE | ORAL | 1 refills | Status: DC

## 2020-11-09 NOTE — Progress Notes (Signed)
Chief Complaint:   OBESITY Nancy Sanders is here to discuss her progress with her obesity treatment plan along with follow-up of her obesity related diagnoses. Nancy Sanders is on the Category 2 Plan or keeping a food journal and adhering to recommended goals of 1200 calories and 90+ grams of protein daily and states she is following her eating plan approximately 20% of the time. Nancy Sanders states she is doing 0 minutes 0 times per week.  Today's visit was #: 24 Starting weight: 194 lbs Starting date: 03/03/2019 Today's weight: 173 lbs Today's date: 11/01/2020 Total lbs lost to date: 21 Total lbs lost since last in-office visit: 0  Interim History: Nancy Sanders's last visit was approximately 6 weeks ago and she has really struggled with her eating plan since then and she has done some celebration eating. She is ready to get back on track.  Subjective:   1. Vitamin D deficiency Nancy Sanders is on Vit D prescription, and her last Vit D level was at goal recently. She denies nausea, vomiting, or muscle weakness.  2. B12 deficiency Nancy Sanders has a history of B12 deficiency, and she is on B12 injections. She requests a refill today. She denies signs of anemia or gastric surgery.  3. At risk for impaired metabolic function Nancy Sanders is at increased risk for impaired metabolic function if protein decreases.  Assessment/Plan:   1. Vitamin D deficiency Low Vitamin D level contributes to fatigue and are associated with obesity, breast, and colon cancer. We will refill prescription Vitamin D for 2 months. Nancy Sanders will follow-up for routine testing of Vitamin D, at least 2-3 times per year to avoid over-replacement.  - Vitamin D, Ergocalciferol, (DRISDOL) 1.25 MG (50000 UNIT) CAPS capsule; TAKE 1 CAPSULE BY MOUTH EVERY 14 DAYS  Dispense: 2 capsule; Refill: 1  2. B12 deficiency The diagnosis was reviewed with the patient. We will refill Vitamin B12 for 2 months. Nancy Sanders will continue to follow up as directed.  Orders and follow up as documented in patient record.  - cyanocobalamin (,VITAMIN B-12,) 1000 MCG/ML injection; Inject 1 mL (1,000 mcg total) into the muscle every 14 (fourteen) days.  Dispense: 2 mL; Refill: 1  3. At risk for impaired metabolic function Nancy Sanders was given approximately 15 minutes of impaired  metabolic function prevention counseling today. We discussed intensive lifestyle modifications today with an emphasis on specific nutrition and exercise instructions and strategies.   Repetitive spaced learning was employed today to elicit superior memory formation and behavioral change.  4. Obesity with current BMI 30.8 Nancy Sanders is currently in the action stage of change. As such, her goal is to continue with weight loss efforts. She has agreed to the Category 2 Plan or keeping a food journal and adhering to recommended goals of 1200-1400 calories and 80+ grams of protein daily.   Behavioral modification strategies: increasing lean protein intake and celebration eating strategies.  Nancy Sanders has agreed to follow-up with our clinic in 3 weeks. She was informed of the importance of frequent follow-up visits to maximize her success with intensive lifestyle modifications for her multiple health conditions.   Objective:   Blood pressure 98/66, pulse 100, temperature 97.6 F (36.4 C), height 5\' 3"  (1.6 m), weight 173 lb (78.5 kg), SpO2 98 %. Body mass index is 30.65 kg/m.  General: Cooperative, alert, well developed, in no acute distress. HEENT: Conjunctivae and lids unremarkable. Cardiovascular: Regular rhythm.  Lungs: Normal work of breathing. Neurologic: No focal deficits.   Lab Results  Component Value Date  CREATININE 0.61 09/20/2020   BUN 10 09/20/2020   NA 139 09/20/2020   K 4.3 09/20/2020   CL 101 09/20/2020   CO2 23 09/20/2020   Lab Results  Component Value Date   ALT 14 09/20/2020   AST 10 09/20/2020   ALKPHOS 77 09/20/2020   BILITOT 0.6 09/20/2020   Lab  Results  Component Value Date   HGBA1C 5.2 09/20/2020   HGBA1C 5.5 10/08/2019   HGBA1C 5.9 (H) 06/22/2019   Lab Results  Component Value Date   INSULIN 4.6 09/20/2020   INSULIN 6.4 10/08/2019   INSULIN 7.3 06/22/2019   INSULIN 18.2 03/03/2019   Lab Results  Component Value Date   TSH 2.170 09/20/2020   Lab Results  Component Value Date   CHOL 190 09/20/2020   HDL 56 09/20/2020   LDLCALC 119 (H) 09/20/2020   TRIG 83 09/20/2020   Lab Results  Component Value Date   WBC 6.9 06/22/2019   HGB 12.2 06/22/2019   HCT 38.1 06/22/2019   MCV 92 06/22/2019   PLT 284 06/22/2019   No results found for: IRON, TIBC, FERRITIN  Attestation Statements:   Reviewed by clinician on day of visit: allergies, medications, problem list, medical history, surgical history, family history, social history, and previous encounter notes.   I, Trixie Dredge, am acting as transcriptionist for Dennard Nip, MD.  I have reviewed the above documentation for accuracy and completeness, and I agree with the above. -  Dennard Nip, MD

## 2020-11-21 ENCOUNTER — Ambulatory Visit (INDEPENDENT_AMBULATORY_CARE_PROVIDER_SITE_OTHER): Payer: 59 | Admitting: Family Medicine

## 2020-11-21 ENCOUNTER — Other Ambulatory Visit: Payer: Self-pay

## 2020-11-21 ENCOUNTER — Encounter (INDEPENDENT_AMBULATORY_CARE_PROVIDER_SITE_OTHER): Payer: Self-pay | Admitting: Family Medicine

## 2020-11-21 VITALS — BP 107/72 | HR 65 | Temp 97.6°F | Ht 63.0 in | Wt 166.0 lb

## 2020-11-21 DIAGNOSIS — E669 Obesity, unspecified: Secondary | ICD-10-CM | POA: Diagnosis not present

## 2020-11-21 DIAGNOSIS — Z6834 Body mass index (BMI) 34.0-34.9, adult: Secondary | ICD-10-CM | POA: Diagnosis not present

## 2020-11-21 DIAGNOSIS — Z9189 Other specified personal risk factors, not elsewhere classified: Secondary | ICD-10-CM

## 2020-11-21 DIAGNOSIS — E538 Deficiency of other specified B group vitamins: Secondary | ICD-10-CM | POA: Diagnosis not present

## 2020-11-21 DIAGNOSIS — E559 Vitamin D deficiency, unspecified: Secondary | ICD-10-CM

## 2020-11-21 MED ORDER — CYANOCOBALAMIN 1000 MCG/ML IJ SOLN: 1000.0000 ug | INTRAMUSCULAR | 1 refills | Status: DC

## 2020-11-21 MED ORDER — VITAMIN D (ERGOCALCIFEROL) 1.25 MG (50000 UNIT) PO CAPS: ORAL_CAPSULE | ORAL | 1 refills | Status: DC

## 2020-11-25 ENCOUNTER — Other Ambulatory Visit (HOSPITAL_BASED_OUTPATIENT_CLINIC_OR_DEPARTMENT_OTHER): Payer: Self-pay | Admitting: Obstetrics and Gynecology

## 2020-11-25 DIAGNOSIS — Z1231 Encounter for screening mammogram for malignant neoplasm of breast: Secondary | ICD-10-CM

## 2020-11-29 NOTE — Progress Notes (Signed)
Chief Complaint:   OBESITY Nancy Sanders is here to discuss her progress with her obesity treatment plan along with follow-up of her obesity related diagnoses. Nancy Sanders is on the Category 2 Plan or keeping a food journal and adhering to recommended goals of 1200-1400 calories and 80+ grams of protein daily and states she is following her eating plan approximately 85-90% of the time. Nancy Sanders states she is doing 0 minutes 0 times per week.  Today's visit was #: 25 Starting weight: 194 lbs Starting date: 03/03/2019 Today's weight: 166 lbs Today's date: 11/21/2020 Total lbs lost to date: 28 Total lbs lost since last in-office visit: 7  Interim History: Nancy Sanders continues to do well with weight loss. She has gotten back to the Category 2 plan more closely including weighing her protein. She is sleeping well, but her exercise has decreased due to being more busy at work and at home.  Subjective:   1. Vitamin D deficiency Nancy Sanders's last Vit D level was at goal. She denies signs of over-replacement.  2. B12 deficiency Nancy Sanders's last B12 was starting to decrease. She is on B12 supplement and she is trying to increase B12 in her diet.  3. At risk for dehydration Nancy Sanders is at risk for dehydration due to inadequate water intake.  Assessment/Plan:   1. Vitamin D deficiency Low Vitamin D level contributes to fatigue and are associated with obesity, breast, and colon cancer. We will refill prescription Vitamin D for 2 months, and we will recheck labs in 1 month. Nancy Sanders will follow-up for routine testing of Vitamin D, at least 2-3 times per year to avoid over-replacement.  - Vitamin D, Ergocalciferol, (DRISDOL) 1.25 MG (50000 UNIT) CAPS capsule; TAKE 1 CAPSULE BY MOUTH EVERY 14 DAYS  Dispense: 2 capsule; Refill: 1  2. B12 deficiency The diagnosis was reviewed with the patient. We will refill B12 for 2 months, and we will recheck labs in 1 month. Nancy Sanders will continue to follow up as  directed. Orders and follow up as documented in patient record.  - cyanocobalamin (,VITAMIN B-12,) 1000 MCG/ML injection; Inject 1 mL (1,000 mcg total) into the muscle every 14 (fourteen) days.  Dispense: 2 mL; Refill: 1  3. At risk for dehydration Nancy Sanders was given approximately 15 minutes dehydration prevention counseling today. Nancy Sanders is at risk for dehydration due to weight loss and current medication(s). She was encouraged to hydrate and monitor fluid status to avoid dehydration as well as weight loss plateaus.   4. Obesity with current BMI 29.5 Nancy Sanders is currently in the action stage of change. As such, her goal is to continue with weight loss efforts. She has agreed to the Category 2 Plan or keeping a food journal and adhering to recommended goals of 1200-1400 calories and 80+ grams of protein daily.   Behavioral modification strategies: increasing lean protein intake and increasing water intake.  Nancy Sanders has agreed to follow-up with our clinic in 4 weeks. She was informed of the importance of frequent follow-up visits to maximize her success with intensive lifestyle modifications for her multiple health conditions.   Objective:   Blood pressure 107/72, pulse 65, temperature 97.6 F (36.4 C), height 5\' 3"  (1.6 m), weight 166 lb (75.3 kg), SpO2 99 %. Body mass index is 29.41 kg/m.  General: Cooperative, alert, well developed, in no acute distress. HEENT: Conjunctivae and lids unremarkable. Cardiovascular: Regular rhythm.  Lungs: Normal work of breathing. Neurologic: No focal deficits.   Lab Results  Component Value Date   CREATININE  0.61 09/20/2020   BUN 10 09/20/2020   NA 139 09/20/2020   K 4.3 09/20/2020   CL 101 09/20/2020   CO2 23 09/20/2020   Lab Results  Component Value Date   ALT 14 09/20/2020   AST 10 09/20/2020   ALKPHOS 77 09/20/2020   BILITOT 0.6 09/20/2020   Lab Results  Component Value Date   HGBA1C 5.2 09/20/2020   HGBA1C 5.5 10/08/2019    HGBA1C 5.9 (H) 06/22/2019   Lab Results  Component Value Date   INSULIN 4.6 09/20/2020   INSULIN 6.4 10/08/2019   INSULIN 7.3 06/22/2019   INSULIN 18.2 03/03/2019   Lab Results  Component Value Date   TSH 2.170 09/20/2020   Lab Results  Component Value Date   CHOL 190 09/20/2020   HDL 56 09/20/2020   LDLCALC 119 (H) 09/20/2020   TRIG 83 09/20/2020   Lab Results  Component Value Date   VD25OH 58.5 09/20/2020   VD25OH 69.2 10/08/2019   VD25OH 49.4 06/22/2019   Lab Results  Component Value Date   WBC 6.9 06/22/2019   HGB 12.2 06/22/2019   HCT 38.1 06/22/2019   MCV 92 06/22/2019   PLT 284 06/22/2019   No results found for: IRON, TIBC, FERRITIN  Attestation Statements:   Reviewed by clinician on day of visit: allergies, medications, problem list, medical history, surgical history, family history, social history, and previous encounter notes.   I, Trixie Dredge, am acting as transcriptionist for Dennard Nip, MD.  I have reviewed the above documentation for accuracy and completeness, and I agree with the above. -  Dennard Nip, MD

## 2020-12-19 ENCOUNTER — Encounter (INDEPENDENT_AMBULATORY_CARE_PROVIDER_SITE_OTHER): Payer: Self-pay

## 2020-12-20 ENCOUNTER — Encounter (INDEPENDENT_AMBULATORY_CARE_PROVIDER_SITE_OTHER): Payer: Self-pay | Admitting: Family Medicine

## 2020-12-20 ENCOUNTER — Other Ambulatory Visit: Payer: Self-pay

## 2020-12-20 ENCOUNTER — Ambulatory Visit (INDEPENDENT_AMBULATORY_CARE_PROVIDER_SITE_OTHER): Payer: 59 | Admitting: Family Medicine

## 2020-12-20 VITALS — BP 109/69 | HR 61 | Temp 97.7°F | Ht 63.0 in | Wt 168.0 lb

## 2020-12-20 DIAGNOSIS — Z9189 Other specified personal risk factors, not elsewhere classified: Secondary | ICD-10-CM | POA: Diagnosis not present

## 2020-12-20 DIAGNOSIS — E538 Deficiency of other specified B group vitamins: Secondary | ICD-10-CM

## 2020-12-20 DIAGNOSIS — Z6834 Body mass index (BMI) 34.0-34.9, adult: Secondary | ICD-10-CM

## 2020-12-20 DIAGNOSIS — E669 Obesity, unspecified: Secondary | ICD-10-CM

## 2020-12-20 DIAGNOSIS — E559 Vitamin D deficiency, unspecified: Secondary | ICD-10-CM

## 2020-12-20 MED ORDER — VITAMIN D (ERGOCALCIFEROL) 1.25 MG (50000 UNIT) PO CAPS
50000.0000 [IU] | ORAL_CAPSULE | ORAL | 0 refills | Status: DC
Start: 2020-12-20 — End: 2021-03-21

## 2020-12-20 MED ORDER — CYANOCOBALAMIN 1000 MCG/ML IJ SOLN: 1000.0000 ug | INTRAMUSCULAR | 0 refills | Status: AC

## 2020-12-23 NOTE — Progress Notes (Signed)
Chief Complaint:   OBESITY Nancy Sanders is here to discuss her progress with her obesity treatment plan along with follow-up of her obesity related diagnoses. Nancy Sanders is on the Category 2 Plan and keeping a food journal and adhering to recommended goals of 1200-1400 calories and 80+ grams of protein daily and states she is following her eating plan approximately 70% of the time. Nancy Sanders states she is doing 0 minutes 0 times per week.  Today's visit was #: 26 Starting weight: 194 lbs Starting date: 03/03/2019 Today's weight: 168 lbs Today's date: 12/20/2020 Total lbs lost to date: 26 Total lbs lost since last in-office visit: 0  Interim History: Modest is retaining some fluid today. She has done well maintaining her weight and she wonders if she is now at a healthy weight.  Subjective:   1. B12 deficiency Nancy Sanders is stable on B12 injection. She denies signs of anemia or B12 side effects.  2. Vitamin D deficiency Nancy Sanders is stable on Vit D, and her level is now at goal. She denies signs of over-replacement.  3. At risk for dehydration Nancy Sanders at risk for dehydration due to inadequate water intake.  Assessment/Plan:   1. B12 deficiency The diagnosis was reviewed with the patient. Nancy Sanders will continue B12 and we will refill for 90 days with no refills. Orders and follow up as documented in patient record.  - cyanocobalamin (,VITAMIN B-12,) 1000 MCG/ML injection; Inject 1 mL (1,000 mcg total) into the muscle every 14 (fourteen) days.  Dispense: 6 mL; Refill: 0  2. Vitamin D deficiency Low Vitamin D level contributes to fatigue and are associated with obesity, breast, and colon cancer. We will refill prescription Vitamin D for 90 days with no refills. Nancy Sanders will follow-up for routine testing of Vitamin D, at least 2-3 times per year to avoid over-replacement.  - Vitamin D, Ergocalciferol, (DRISDOL) 1.25 MG (50000 UNIT) CAPS capsule; Take 1 capsule (50,000 Units total) by  mouth every 14 (fourteen) days. TAKE 1 CAPSULE BY MOUTH EVERY 14 DAYS  Dispense: 6 capsule; Refill: 0  3. At risk for dehydration Nancy Sanders was given approximately 15 minutes dehydration prevention counseling today. Nancy Sanders is at risk for dehydration due to weight loss and current medication(s). She was encouraged to hydrate and monitor fluid status to avoid dehydration as well as weight loss plateaus.   4. Obesity with current BMI 29.9 Nancy Sanders is currently in the action stage of change. As such, her goal is to maintain weight for now. She has agreed to keeping a food journal and adhering to recommended goals of 1200-1400 calories and 80+ grams of protein daily.   Nancy Sanders visceral fat rating is at goal, and she has a higher % of muscle than fat. So from a heath standpoint she is at a healthy weight for her. We will change to weight maintenance.  Behavioral modification strategies: increasing lean protein intake and meal planning and cooking strategies.  Nancy Sanders has agreed to follow-up with our clinic in 3 months. She was informed of the importance of frequent follow-up visits to maximize her success with intensive lifestyle modifications for her multiple health conditions.   Objective:   Blood pressure 109/69, pulse 61, temperature 97.7 F (36.5 C), height '5\' 3"'$  (1.6 m), weight 168 lb (76.2 kg), SpO2 99 %. Body mass index is 29.76 kg/m.  General: Cooperative, alert, well developed, in no acute distress. HEENT: Conjunctivae and lids unremarkable. Cardiovascular: Regular rhythm.  Lungs: Normal work of breathing. Neurologic: No focal deficits.  Lab Results  Component Value Date   CREATININE 0.61 09/20/2020   BUN 10 09/20/2020   NA 139 09/20/2020   K 4.3 09/20/2020   CL 101 09/20/2020   CO2 23 09/20/2020   Lab Results  Component Value Date   ALT 14 09/20/2020   AST 10 09/20/2020   ALKPHOS 77 09/20/2020   BILITOT 0.6 09/20/2020   Lab Results  Component Value Date    HGBA1C 5.2 09/20/2020   HGBA1C 5.5 10/08/2019   HGBA1C 5.9 (H) 06/22/2019   Lab Results  Component Value Date   INSULIN 4.6 09/20/2020   INSULIN 6.4 10/08/2019   INSULIN 7.3 06/22/2019   INSULIN 18.2 03/03/2019   Lab Results  Component Value Date   TSH 2.170 09/20/2020   Lab Results  Component Value Date   CHOL 190 09/20/2020   HDL 56 09/20/2020   LDLCALC 119 (H) 09/20/2020   TRIG 83 09/20/2020   Lab Results  Component Value Date   VD25OH 58.5 09/20/2020   VD25OH 69.2 10/08/2019   VD25OH 49.4 06/22/2019   Lab Results  Component Value Date   WBC 6.9 06/22/2019   HGB 12.2 06/22/2019   HCT 38.1 06/22/2019   MCV 92 06/22/2019   PLT 284 06/22/2019   No results found for: IRON, TIBC, FERRITIN  Attestation Statements:   Reviewed by clinician on day of visit: allergies, medications, problem list, medical history, surgical history, family history, social history, and previous encounter notes.   I, Trixie Dredge, am acting as transcriptionist for Dennard Nip, MD.  I have reviewed the above documentation for accuracy and completeness, and I agree with the above. -  Dennard Nip, MD

## 2021-01-17 ENCOUNTER — Other Ambulatory Visit: Payer: Self-pay

## 2021-01-17 ENCOUNTER — Encounter (HOSPITAL_BASED_OUTPATIENT_CLINIC_OR_DEPARTMENT_OTHER): Payer: Self-pay

## 2021-01-17 ENCOUNTER — Ambulatory Visit (HOSPITAL_BASED_OUTPATIENT_CLINIC_OR_DEPARTMENT_OTHER)
Admission: RE | Admit: 2021-01-17 | Discharge: 2021-01-17 | Disposition: A | Discharge status: Home / Self Care | Payer: 59 | Source: Ambulatory Visit | Attending: Obstetrics and Gynecology | Admitting: Obstetrics and Gynecology

## 2021-01-17 DIAGNOSIS — Z1231 Encounter for screening mammogram for malignant neoplasm of breast: Secondary | ICD-10-CM | POA: Diagnosis not present

## 2021-03-21 ENCOUNTER — Encounter (INDEPENDENT_AMBULATORY_CARE_PROVIDER_SITE_OTHER): Payer: Self-pay | Admitting: Family Medicine

## 2021-03-21 ENCOUNTER — Ambulatory Visit (INDEPENDENT_AMBULATORY_CARE_PROVIDER_SITE_OTHER): Payer: 59 | Admitting: Family Medicine

## 2021-03-21 ENCOUNTER — Other Ambulatory Visit: Payer: Self-pay

## 2021-03-21 VITALS — BP 148/79 | HR 65 | Temp 97.8°F | Ht 63.0 in | Wt 168.0 lb

## 2021-03-21 DIAGNOSIS — E669 Obesity, unspecified: Secondary | ICD-10-CM

## 2021-03-21 DIAGNOSIS — Z9189 Other specified personal risk factors, not elsewhere classified: Secondary | ICD-10-CM

## 2021-03-21 DIAGNOSIS — E538 Deficiency of other specified B group vitamins: Secondary | ICD-10-CM | POA: Diagnosis not present

## 2021-03-21 DIAGNOSIS — I10 Essential (primary) hypertension: Secondary | ICD-10-CM

## 2021-03-21 DIAGNOSIS — E559 Vitamin D deficiency, unspecified: Secondary | ICD-10-CM

## 2021-03-21 DIAGNOSIS — Z6834 Body mass index (BMI) 34.0-34.9, adult: Secondary | ICD-10-CM

## 2021-03-21 MED ORDER — VITAMIN D (ERGOCALCIFEROL) 1.25 MG (50000 UNIT) PO CAPS: 50000.0000 [IU] | ORAL_CAPSULE | ORAL | 0 refills | Status: AC

## 2021-03-21 NOTE — Progress Notes (Signed)
Chief Complaint:   OBESITY Nancy Sanders is here to discuss her progress with her obesity treatment plan along with follow-up of her obesity related diagnoses. Nancy Sanders is on keeping a food journal and adhering to recommended goals of 1200-1400 calories and 80+ grams of protein daily and states she is following her eating plan approximately 50% of the time. Nancy Sanders states she is walking for 30-45 minutes 5 times per week.  Today's visit was #: 27 Starting weight: 194 lbs Starting date: 03/03/2019 Today's weight: 168 lbs Today's date: 03/21/2021 Total lbs lost to date: 26 Total lbs lost since last in-office visit: 0  Interim History: Hellon has done well with maintaining her weight loss. She is dealing with a lot of stressors with a terminal diagnosis for her father, and she has fluctuated with decreased eating due to grieving.  Subjective:   1. Vitamin D deficiency Nancy Sanders has been of Vit D for 3-4 months, and she notes fatigue.  2. B12 deficiency Nancy Sanders is on B12 injections, and she has no recent labs.  3. Essential hypertension Nancy Sanders's blood pressure has increased significantly in the last 3 months, it had been well controlled on lisinopril 10 mg. She has now been on 20 mg for 1 week and her blood pressure is still elevated. She has had increased anxiety which may have contributed to her elevated blood pressure.  4. At risk for impaired metabolic function Nancy Sanders is at increased risk for impaired metabolic function if calories or protein decreases too low.  Assessment/Plan:   1. Vitamin D deficiency Low Vitamin D level contributes to fatigue and are associated with obesity, breast, and colon cancer. We will refill prescription Vitamin D for 90 days with no refills.  We will check labs today, and Nancy Sanders will follow-up for routine testing of Vitamin D, at least 2-3 times per year to avoid over-replacement.  - VITAMIN D 25 Hydroxy (Vit-D Deficiency, Fractures) -  Vitamin D, Ergocalciferol, (DRISDOL) 1.25 MG (50000 UNIT) CAPS capsule; Take 1 capsule (50,000 Units total) by mouth every 14 (fourteen) days. TAKE 1 CAPSULE BY MOUTH EVERY 14 DAYS  Dispense: 6 capsule; Refill: 0  2. B12 deficiency The diagnosis was reviewed with the patient. We will check labs today, and we will follow up at Nancy Sanders's next office visit. Orders and follow up as documented in patient record.  - Vitamin B12  3. Essential hypertension Nancy Sanders is to follow up with her primary care provider if her blood pressure is still elevated after another week. She may need a more in depth evaluation to look at other causes for sudden elevation in blood pressure. She will continue working on healthy weight loss and exercise to improve blood pressure control. She will watch for signs of hypotension as she continues her lifestyle modifications.  4. At risk for impaired metabolic function Nancy Sanders was given approximately 15 minutes of impaired  metabolic function prevention counseling today. We discussed intensive lifestyle modifications today with an emphasis on specific nutrition and exercise instructions and strategies.   Repetitive spaced learning was employed today to elicit superior memory formation and behavioral change.  5. Obesity with current BMI 29.9 Nancy Sanders is currently in the action stage of change. As such, her goal is to maintain weight for now. She has agreed to practicing portion control and making smarter food choices, such as increasing vegetables and decreasing simple carbohydrates.   Maintenance strategies were discussed today to help keep her RMR from decreasing.  Exercise goals: As is.  Behavioral  modification strategies: no skipping meals, emotional eating strategies, and holiday eating strategies .  Nancy Sanders has agreed to follow-up with our clinic in 3 months. She was informed of the importance of frequent follow-up visits to maximize her success with intensive  lifestyle modifications for her multiple health conditions.   Nancy Sanders was informed we would discuss her lab results at her next visit unless there is a critical issue that needs to be addressed sooner. Nancy Sanders agreed to keep her next visit at the agreed upon time to discuss these results.  Objective:   Blood pressure (!) 148/79, pulse 65, temperature 97.8 F (36.6 C), height 5\' 3"  (1.6 m), weight 168 lb (76.2 kg), SpO2 98 %. Body mass index is 29.76 kg/m.  General: Cooperative, alert, well developed, in no acute distress. HEENT: Conjunctivae and lids unremarkable. Cardiovascular: Regular rhythm.  Lungs: Normal work of breathing. Neurologic: No focal deficits.   Lab Results  Component Value Date   CREATININE 0.61 09/20/2020   BUN 10 09/20/2020   NA 139 09/20/2020   K 4.3 09/20/2020   CL 101 09/20/2020   CO2 23 09/20/2020   Lab Results  Component Value Date   ALT 14 09/20/2020   AST 10 09/20/2020   ALKPHOS 77 09/20/2020   BILITOT 0.6 09/20/2020   Lab Results  Component Value Date   HGBA1C 5.2 09/20/2020   HGBA1C 5.5 10/08/2019   HGBA1C 5.9 (H) 06/22/2019   Lab Results  Component Value Date   INSULIN 4.6 09/20/2020   INSULIN 6.4 10/08/2019   INSULIN 7.3 06/22/2019   INSULIN 18.2 03/03/2019   Lab Results  Component Value Date   TSH 2.170 09/20/2020   Lab Results  Component Value Date   CHOL 190 09/20/2020   HDL 56 09/20/2020   LDLCALC 119 (H) 09/20/2020   TRIG 83 09/20/2020   Lab Results  Component Value Date   VD25OH 58.5 09/20/2020   VD25OH 69.2 10/08/2019   VD25OH 49.4 06/22/2019   Lab Results  Component Value Date   WBC 6.9 06/22/2019   HGB 12.2 06/22/2019   HCT 38.1 06/22/2019   MCV 92 06/22/2019   PLT 284 06/22/2019   No results found for: IRON, TIBC, FERRITIN  Attestation Statements:   Reviewed by clinician on day of visit: allergies, medications, problem list, medical history, surgical history, family history, social history, and  previous encounter notes.   I, Trixie Dredge, am acting as transcriptionist for Dennard Nip, MD.  I have reviewed the above documentation for accuracy and completeness, and I agree with the above. -  Dennard Nip, MD

## 2021-03-22 LAB — VITAMIN B12: Vitamin B-12: 376 pg/mL (ref 232–1245)

## 2021-03-22 LAB — VITAMIN D 25 HYDROXY (VIT D DEFICIENCY, FRACTURES): Vit D, 25-Hydroxy: 29.2 ng/mL — ABNORMAL LOW (ref 30.0–100.0)

## 2021-06-19 ENCOUNTER — Ambulatory Visit (INDEPENDENT_AMBULATORY_CARE_PROVIDER_SITE_OTHER): Payer: 59 | Admitting: Family Medicine

## 2021-07-27 ENCOUNTER — Encounter (INDEPENDENT_AMBULATORY_CARE_PROVIDER_SITE_OTHER): Payer: Self-pay

## 2021-08-02 ENCOUNTER — Other Ambulatory Visit (INDEPENDENT_AMBULATORY_CARE_PROVIDER_SITE_OTHER): Payer: Self-pay | Admitting: Family Medicine

## 2021-08-02 DIAGNOSIS — E538 Deficiency of other specified B group vitamins: Secondary | ICD-10-CM

## 2021-08-02 DIAGNOSIS — E559 Vitamin D deficiency, unspecified: Secondary | ICD-10-CM

## 2021-08-28 ENCOUNTER — Ambulatory Visit (INDEPENDENT_AMBULATORY_CARE_PROVIDER_SITE_OTHER): Payer: 59 | Admitting: Family Medicine

## 2022-01-03 ENCOUNTER — Encounter (INDEPENDENT_AMBULATORY_CARE_PROVIDER_SITE_OTHER): Payer: Self-pay

## 2022-01-22 ENCOUNTER — Other Ambulatory Visit (HOSPITAL_BASED_OUTPATIENT_CLINIC_OR_DEPARTMENT_OTHER): Payer: Self-pay | Admitting: Physician Assistant

## 2022-01-22 DIAGNOSIS — Z1231 Encounter for screening mammogram for malignant neoplasm of breast: Secondary | ICD-10-CM

## 2022-02-14 ENCOUNTER — Encounter (HOSPITAL_BASED_OUTPATIENT_CLINIC_OR_DEPARTMENT_OTHER): Payer: Self-pay

## 2022-02-14 ENCOUNTER — Ambulatory Visit (HOSPITAL_BASED_OUTPATIENT_CLINIC_OR_DEPARTMENT_OTHER)
Admission: RE | Admit: 2022-02-14 | Discharge: 2022-02-14 | Disposition: A | Discharge status: Home / Self Care | Payer: BC Managed Care – PPO | Source: Ambulatory Visit | Attending: Physician Assistant | Admitting: Physician Assistant

## 2022-02-14 DIAGNOSIS — Z1231 Encounter for screening mammogram for malignant neoplasm of breast: Secondary | ICD-10-CM | POA: Insufficient documentation

## 2022-02-16 ENCOUNTER — Other Ambulatory Visit: Payer: Self-pay | Admitting: Physician Assistant

## 2022-02-16 DIAGNOSIS — R928 Other abnormal and inconclusive findings on diagnostic imaging of breast: Secondary | ICD-10-CM

## 2022-02-26 ENCOUNTER — Ambulatory Visit: Payer: BC Managed Care – PPO

## 2022-02-26 ENCOUNTER — Ambulatory Visit
Admission: RE | Admit: 2022-02-26 | Discharge: 2022-02-26 | Disposition: A | Discharge status: Home / Self Care | Payer: BC Managed Care – PPO | Source: Ambulatory Visit | Attending: Physician Assistant | Admitting: Physician Assistant

## 2022-02-26 DIAGNOSIS — R928 Other abnormal and inconclusive findings on diagnostic imaging of breast: Secondary | ICD-10-CM

## 2024-04-17 ENCOUNTER — Other Ambulatory Visit: Payer: Self-pay | Admitting: Physician Assistant

## 2024-04-17 DIAGNOSIS — Z1231 Encounter for screening mammogram for malignant neoplasm of breast: Secondary | ICD-10-CM

## 2024-05-19 ENCOUNTER — Ambulatory Visit
Admission: RE | Admit: 2024-05-19 | Discharge: 2024-05-19 | Disposition: A | Discharge status: Home / Self Care | Source: Ambulatory Visit | Attending: Physician Assistant | Admitting: Physician Assistant

## 2024-05-19 DIAGNOSIS — Z1231 Encounter for screening mammogram for malignant neoplasm of breast: Secondary | ICD-10-CM
